# Patient Record
Sex: Female | Born: 1965 | Race: Black or African American | Hispanic: No | Marital: Married | Smoking: Former smoker
Health system: Southern US, Community
[De-identification: ages and names within clinical notes are randomized; demographics above are authoritative.]

## PROBLEM LIST (undated history)

## (undated) DIAGNOSIS — R600 Localized edema: Secondary | ICD-10-CM

## (undated) DIAGNOSIS — M1990 Unspecified osteoarthritis, unspecified site: Secondary | ICD-10-CM

## (undated) DIAGNOSIS — F419 Anxiety disorder, unspecified: Secondary | ICD-10-CM

## (undated) DIAGNOSIS — F32A Depression, unspecified: Secondary | ICD-10-CM

## (undated) DIAGNOSIS — I10 Essential (primary) hypertension: Secondary | ICD-10-CM

## (undated) HISTORY — PX: ABDOMINAL SURGERY: SHX537

## (undated) HISTORY — PX: OTHER SURGICAL HISTORY: SHX169

---

## 2011-01-17 ENCOUNTER — Emergency Department: Payer: Self-pay | Admitting: Emergency Medicine

## 2011-02-24 ENCOUNTER — Emergency Department: Payer: Self-pay | Admitting: Emergency Medicine

## 2011-06-27 ENCOUNTER — Emergency Department: Payer: Self-pay | Admitting: Emergency Medicine

## 2012-08-09 ENCOUNTER — Emergency Department: Payer: Self-pay | Admitting: Emergency Medicine

## 2012-09-07 ENCOUNTER — Emergency Department: Payer: Self-pay | Admitting: Emergency Medicine

## 2012-09-22 ENCOUNTER — Emergency Department: Payer: Self-pay | Admitting: Emergency Medicine

## 2012-11-23 ENCOUNTER — Emergency Department: Payer: Self-pay | Admitting: Unknown Physician Specialty

## 2012-11-24 LAB — URINALYSIS, COMPLETE
Blood: NEGATIVE
Glucose,UR: NEGATIVE mg/dL (ref 0–75)
Nitrite: NEGATIVE
Ph: 5 (ref 4.5–8.0)
Protein: 30
Specific Gravity: 1.035 (ref 1.003–1.030)
Squamous Epithelial: 1

## 2012-11-24 LAB — COMPREHENSIVE METABOLIC PANEL
Anion Gap: 10 (ref 7–16)
Chloride: 106 mmol/L (ref 98–107)
Co2: 26 mmol/L (ref 21–32)
Creatinine: 0.76 mg/dL (ref 0.60–1.30)
EGFR (African American): >60
Potassium: 3.9 mmol/L (ref 3.5–5.1)
SGOT(AST): 17 U/L (ref 15–37)
SGPT (ALT): 14 U/L (ref 12–78)
Sodium: 142 mmol/L (ref 136–145)
Total Protein: 8.4 g/dL — ABNORMAL HIGH (ref 6.4–8.2)

## 2012-11-24 LAB — CBC
HCT: 38.4 % (ref 35.0–47.0)
MCH: 31.6 pg (ref 26.0–34.0)
MCHC: 33.8 g/dL (ref 32.0–36.0)
MCV: 93 fL (ref 80–100)
RBC: 4.11 10*6/uL (ref 3.80–5.20)

## 2013-06-21 ENCOUNTER — Emergency Department: Payer: Self-pay | Admitting: Emergency Medicine

## 2013-07-01 ENCOUNTER — Emergency Department: Payer: Self-pay | Admitting: Emergency Medicine

## 2013-07-04 ENCOUNTER — Emergency Department: Payer: Self-pay | Admitting: Emergency Medicine

## 2013-07-04 LAB — COMPREHENSIVE METABOLIC PANEL
Albumin: 3.7 g/dL (ref 3.4–5.0)
Anion Gap: 5 — ABNORMAL LOW (ref 7–16)
BUN: 17 mg/dL (ref 7–18)
Creatinine: 0.89 mg/dL (ref 0.60–1.30)
EGFR (Non-African Amer.): >60
Glucose: 91 mg/dL (ref 65–99)
Osmolality: 279 (ref 275–301)
Potassium: 3.8 mmol/L (ref 3.5–5.1)
Sodium: 139 mmol/L (ref 136–145)
Total Protein: 8 g/dL (ref 6.4–8.2)

## 2013-07-04 LAB — DRUG SCREEN, URINE
Amphetamines, Ur Screen: NEGATIVE (ref ?–1000)
Barbiturates, Ur Screen: NEGATIVE (ref ?–200)
Cocaine Metabolite,Ur ~~LOC~~: POSITIVE (ref ?–300)
MDMA (Ecstasy)Ur Screen: NEGATIVE (ref ?–500)
Phencyclidine (PCP) Ur S: NEGATIVE (ref ?–25)

## 2013-07-04 LAB — URINALYSIS, COMPLETE
Bilirubin,UR: NEGATIVE
Glucose,UR: NEGATIVE mg/dL (ref 0–75)
Ketone: NEGATIVE
Leukocyte Esterase: NEGATIVE
Nitrite: NEGATIVE
Protein: 30
RBC,UR: 6 /HPF (ref 0–5)
Specific Gravity: 1.036 (ref 1.003–1.030)
Squamous Epithelial: 1
WBC UR: 1 /HPF (ref 0–5)

## 2013-07-04 LAB — CBC
HCT: 39.8 % (ref 35.0–47.0)
MCH: 31.7 pg (ref 26.0–34.0)
MCHC: 35.1 g/dL (ref 32.0–36.0)
MCV: 90 fL (ref 80–100)
RDW: 14 % (ref 11.5–14.5)
WBC: 5.5 10*3/uL (ref 3.6–11.0)

## 2013-07-04 LAB — TROPONIN I: Troponin-I: <0.02 ng/mL

## 2013-12-10 ENCOUNTER — Emergency Department: Payer: Self-pay | Admitting: Emergency Medicine

## 2014-01-12 ENCOUNTER — Emergency Department: Payer: Self-pay | Admitting: Emergency Medicine

## 2015-05-28 ENCOUNTER — Encounter: Payer: Self-pay | Admitting: Emergency Medicine

## 2015-05-28 ENCOUNTER — Emergency Department
Admission: EM | Admit: 2015-05-28 | Discharge: 2015-05-28 | Disposition: A | Discharge status: Home / Self Care | Payer: Self-pay | Attending: Emergency Medicine | Admitting: Emergency Medicine

## 2015-05-28 DIAGNOSIS — J029 Acute pharyngitis, unspecified: Secondary | ICD-10-CM | POA: Insufficient documentation

## 2015-05-28 LAB — CBC WITH DIFFERENTIAL/PLATELET
Basophils Absolute: 0.1 10*3/uL (ref 0–0.1)
Basophils Relative: 1 %
EOS PCT: 4 %
Eosinophils Absolute: 0.3 10*3/uL (ref 0–0.7)
HCT: 40.5 % (ref 35.0–47.0)
Hemoglobin: 13.6 g/dL (ref 12.0–16.0)
LYMPHS PCT: 26 %
Lymphs Abs: 2.4 10*3/uL (ref 1.0–3.6)
MCH: 31 pg (ref 26.0–34.0)
MCHC: 33.6 g/dL (ref 32.0–36.0)
MCV: 92.2 fL (ref 80.0–100.0)
MONOS PCT: 9 %
Monocytes Absolute: 0.8 10*3/uL (ref 0.2–0.9)
Neutro Abs: 5.5 10*3/uL (ref 1.4–6.5)
Neutrophils Relative %: 60 %
Platelets: 197 10*3/uL (ref 150–440)
RBC: 4.39 MIL/uL (ref 3.80–5.20)
RDW: 14.2 % (ref 11.5–14.5)
WBC: 9.1 10*3/uL (ref 3.6–11.0)

## 2015-05-28 LAB — BASIC METABOLIC PANEL
ANION GAP: 6 (ref 5–15)
BUN: 13 mg/dL (ref 6–20)
CHLORIDE: 105 mmol/L (ref 101–111)
CO2: 27 mmol/L (ref 22–32)
Calcium: 8.6 mg/dL — ABNORMAL LOW (ref 8.9–10.3)
Creatinine, Ser: 0.88 mg/dL (ref 0.44–1.00)
GFR calc Af Amer: >60 mL/min (ref >60)
GFR calc non Af Amer: >60 mL/min (ref >60)
GLUCOSE: 92 mg/dL (ref 65–99)
Potassium: 4.1 mmol/L (ref 3.5–5.1)
SODIUM: 138 mmol/L (ref 135–145)

## 2015-05-28 MED ORDER — SODIUM CHLORIDE 0.9 % IV BOLUS (SEPSIS)
500.0000 mL | Freq: Once | INTRAVENOUS | Status: AC
  Administered 2015-05-28: 500 mL via INTRAVENOUS

## 2015-05-28 MED ORDER — OXYCODONE-ACETAMINOPHEN 5-325 MG PO TABS
1.0000 | ORAL_TABLET | Freq: Once | ORAL | Status: AC
  Administered 2015-05-28: 1 via ORAL
  Filled 2015-05-28: qty 1

## 2015-05-28 MED ORDER — TRAMADOL HCL 50 MG PO TABS: 50.0000 mg | ORAL_TABLET | Freq: Four times a day (QID) | ORAL | Status: DC | PRN

## 2015-05-28 MED ORDER — AMOXICILLIN-POT CLAVULANATE 875-125 MG PO TABS: 1.0000 | ORAL_TABLET | Freq: Two times a day (BID) | ORAL | Status: AC

## 2015-05-28 MED ORDER — SODIUM CHLORIDE 0.9 % IV SOLN
3.0000 g | Freq: Once | INTRAVENOUS | Status: AC
  Administered 2015-05-28: 3 g via INTRAVENOUS
  Filled 2015-05-28: qty 3

## 2015-05-28 NOTE — Discharge Instructions (Signed)
FOLLOW UP WITH [facility] ABOUT YOUR MEDICATION AND [facility].  PAPERS ARE GIVEN TO YOU TONIGHT TO FILL OUT TO TAKE TO THE OFFICE ULTRAM FOR PAIN AND AUGMENTIN FOR INFECTION INCREASE FLUIDS RETURN TO ER IMMEDIATELY IF UNABLE TO SWALLOW YOUR SPIT, FEVER OF 101 OR UNABLE TO TAKE ANTIBIOTICS TYLENOL IF NEEDED FOR FEVER

## 2015-05-28 NOTE — ED Notes (Signed)
Pt presents with sore throat and fever for two days.

## 2015-05-28 NOTE — ED Provider Notes (Signed)
[facility] Emergency Department Provider Note  ____________________________________________  Time seen: 1634  I have reviewed the triage vital signs and the nursing notes.   HISTORY  Chief Complaint Sore Throat   HPI Lindsay Frye is a 49 y.o. female is here with complaint of sore throat and fever 2 days. She has not taken any over-the-counter medication for this. She does not have a family doctor. She states that talking and swallowing hurts her throat, she is able to swallow her saliva with out any difficulty. She's been drinking fluids at home. She is not aware of being exposed to strep. Pain is constant, she feels in her left ear, she rates her pain a 10 out of 10.  History reviewed. No pertinent past medical history.  There are no active problems to display for this patient.   Past Surgical History  Procedure Laterality Date  . Abdominal surgery      Current Outpatient Rx  Name  Route  Sig  Dispense  Refill  . amoxicillin-clavulanate (AUGMENTIN) 875-125 MG per tablet   Oral   Take 1 tablet by mouth 2 (two) times daily.   14 tablet   0   . traMADol (ULTRAM) 50 MG tablet   Oral   Take 1 tablet (50 mg total) by mouth every 6 (six) hours as needed.   20 tablet   0     Allergies Review of patient's allergies indicates no known allergies.  No family history on file.  Social History History  Substance Use Topics  . Smoking status: Never Smoker   . Smokeless tobacco: Not on file  . Alcohol Use: No    Review of Systems Constitutional: Positive fever/chills Eyes: No visual changes. ENT: Positive sore throat. Cardiovascular: Denies chest pain. Respiratory: Denies shortness of breath. Gastrointestinal: No abdominal pain.  No nausea, no vomiting.  Genitourinary: Negative for dysuria. Musculoskeletal: Negative for back pain. Skin: Negative for rash. Neurological: Negative for headaches, focal weakness or numbness.  10-point ROS  otherwise negative.  ____________________________________________   PHYSICAL EXAM:  VITAL SIGNS: ED Triage Vitals  Enc Vitals Group     BP 05/28/15 1610 124/84 mmHg     Pulse Rate 05/28/15 1610 75     Resp 05/28/15 1610 20     Temp 05/28/15 1610 98.4 F (36.9 C)     Temp Source 05/28/15 1610 Oral     SpO2 05/28/15 1610 97 %     Weight 05/28/15 1610 200 lb (90.719 kg)     Height 05/28/15 1610  (1.702 m)     Head Cir --      Peak Flow --      Pain Score 05/28/15 1610 10     Pain Loc --      Pain Edu? --      Excl. in GC? --     Constitutional: Alert and oriented. Well appearing and in no acute distress. Eyes: Conjunctivae are normal. PERRL. EOMI. Head: Atraumatic. Nose: No congestion/rhinnorhea. Mouth/Throat: Mucous membranes are moist.  Oropharynx erythematous without exudate. Uvula is midline. Patient's voice is muffled Neck: No stridor.  Supple Hematological/Lymphatic/Immunilogical: Moderate bilateral cervical lymphadenopathy. Cardiovascular: Normal rate, regular rhythm. Grossly normal heart sounds.  Good peripheral circulation. Respiratory: Normal respiratory effort.  No retractions. Lungs CTAB. Gastrointestinal: Soft and nontender. No distention. Musculoskeletal: No lower extremity tenderness nor edema.  No joint effusions. Neurologic:  Normal speech and language. No gross focal neurologic deficits are appreciated. No gait instability. Skin:  Skin  is warm, dry and intact. No rash noted. Psychiatric: Mood and affect are normal. Speech and behavior are normal.  ____________________________________________   LABS (all labs ordered are listed, but only abnormal results are displayed)  Labs Reviewed  BASIC METABOLIC PANEL - Abnormal; Notable for the following:    Calcium 8.6 (*)    All other components within normal limits  CBC WITH DIFFERENTIAL/PLATELET    PROCEDURES  Procedure(s) performed: None  Critical Care performed:  No  ____________________________________________   INITIAL IMPRESSION / ASSESSMENT AND PLAN / ED COURSE  Pertinent labs & imaging results that were available during my care of the patient were reviewed by me and considered in my medical decision making (see chart for details).  Patient was able to drink fluids while in the ED but was quite uncomfortable. There is some financial burden and she was given papers for [employer] and [facility].  While in the emergency room she was given Unasyn 3 g IV and Percocet for throat pain. She is instructed to return to the emergency room immediately if unable to swallow her own saliva, fever above 101, or inability to take her medication. Questionable early peritonsillar cellulitis. ____________________________________________   FINAL CLINICAL IMPRESSION(S) / ED DIAGNOSES  Final diagnoses:  Pharyngitis      Tommi Rumps, PA-C 05/28/15 1943  Emily Filbert, MD 05/28/15 2124

## 2015-10-17 ENCOUNTER — Emergency Department: Payer: Self-pay

## 2015-10-17 ENCOUNTER — Encounter: Payer: Self-pay | Admitting: Emergency Medicine

## 2015-10-17 ENCOUNTER — Emergency Department
Admission: EM | Admit: 2015-10-17 | Discharge: 2015-10-17 | Disposition: A | Discharge status: Home / Self Care | Payer: Self-pay | Attending: Emergency Medicine | Admitting: Emergency Medicine

## 2015-10-17 DIAGNOSIS — S93402A Sprain of unspecified ligament of left ankle, initial encounter: Secondary | ICD-10-CM | POA: Insufficient documentation

## 2015-10-17 DIAGNOSIS — Y9301 Activity, walking, marching and hiking: Secondary | ICD-10-CM | POA: Insufficient documentation

## 2015-10-17 DIAGNOSIS — Y92007 Garden or yard of unspecified non-institutional (private) residence as the place of occurrence of the external cause: Secondary | ICD-10-CM | POA: Insufficient documentation

## 2015-10-17 DIAGNOSIS — Y998 Other external cause status: Secondary | ICD-10-CM | POA: Insufficient documentation

## 2015-10-17 DIAGNOSIS — W1839XA Other fall on same level, initial encounter: Secondary | ICD-10-CM | POA: Insufficient documentation

## 2015-10-17 MED ORDER — HYDROCODONE-ACETAMINOPHEN 5-325 MG PO TABS
1.0000 | ORAL_TABLET | Freq: Once | ORAL | Status: AC
  Administered 2015-10-17: 1 via ORAL
  Filled 2015-10-17: qty 1

## 2015-10-17 MED ORDER — HYDROCODONE-ACETAMINOPHEN 5-325 MG PO TABS: 1.0000 | ORAL_TABLET | ORAL | Status: DC | PRN

## 2015-10-17 NOTE — Discharge Instructions (Signed)
Ankle Sprain °An ankle sprain is an injury to the strong, fibrous tissues (ligaments) that hold your ankle bones together.  °HOME CARE  °· Put ice on your ankle for 1-2 days or as told by your doctor. °· Put ice in a plastic bag. °· Place a towel between your skin and the bag. °· Leave the ice on for 15-20 minutes at a time, every 2 hours while you are awake. °· Only take medicine as told by your doctor. °· Raise (elevate) your injured ankle above the level of your heart as much as possible for 2-3 days. °· Use crutches if your doctor tells you to. Slowly put your own weight on the affected ankle. Use the crutches until you can walk without pain. °· If you have a plaster splint: °· Do not rest it on anything harder than a pillow for 24 hours. °· Do not put weight on it. °· Do not get it wet. °· Take it off to shower or bathe. °· If given, use an elastic wrap or support stocking for support. Take the wrap off if your toes lose feeling (numb), tingle, or turn cold or blue. °· If you have an air splint: °· Add or let out air to make it comfortable. °· Take it off at night and to shower and bathe. °· Wiggle your toes and move your ankle up and down often while you are wearing it. °GET HELP IF: °· You have rapidly increasing bruising or puffiness (swelling). °· Your toes feel very cold. °· You lose feeling in your foot. °· Your medicine does not help your pain. °GET HELP RIGHT AWAY IF:  °· Your toes lose feeling (numb) or turn blue. °· You have severe pain that is increasing. °MAKE SURE YOU:  °· Understand these instructions. °· Will watch your condition. °· Will get help right away if you are not doing well or get worse. °  °This information is not intended to replace advice given to you by your health care provider. Make sure you discuss any questions you have with your health care provider. °  °Document Released: 04/07/2008 Document Revised: 11/10/2014 Document Reviewed: 05/03/2012 °Elsevier Interactive Patient  Education ©2016 [employer] ° °Cryotherapy °Cryotherapy is when you put ice on your injury. Ice helps lessen pain and puffiness (swelling) after an injury. Ice works the best when you start using it in the first 24 to 48 hours after an injury. °HOME CARE °· Put a dry or damp towel between the ice pack and your skin. °· You may press gently on the ice pack. °· Leave the ice on for no more than 10 to 20 minutes at a time. °· Check your skin after 5 minutes to make sure your skin is okay. °· Rest at least 20 minutes between ice pack uses. °· Stop using ice when your skin loses feeling (numbness). °· Do not use ice on someone who cannot tell you when it hurts. This includes small children and people with memory problems (dementia). °GET HELP RIGHT AWAY IF: °· You have white spots on your skin. °· Your skin turns blue or pale. °· Your skin feels waxy or hard. °· Your puffiness gets worse. °MAKE SURE YOU:  °· Understand these instructions. °· Will watch your condition. °· Will get help right away if you are not doing well or get worse. °  °This information is not intended to replace advice given to you by your health care provider. Make sure you discuss any   questions you have with your health care provider.   Document Released: 04/07/2008 Document Revised: 01/12/2012 Document Reviewed: 06/12/2011 Elsevier Interactive Patient Education 2016 [employer]    Ice and elevate ankle to reduce swelling and pain. Wear ankle brace for approximately 1 week or until you can bear weight without pain. Take Norco as needed for pain. Follow up with Dr. Ernest PineHooten if any continued problems.

## 2015-10-17 NOTE — ED Notes (Signed)
Left ankle pain s/p fall yesterday and today, c/o pain that starts at the bottom of her foot and runs up the lower shin.

## 2015-10-17 NOTE — ED Provider Notes (Signed)
[facility] Emergency Department Provider Note  ____________________________________________  Time seen: Approximately 11:27 AM  I have reviewed the triage vital signs and the nursing notes.   HISTORY  Chief Complaint Ankle Pain  HPI Lindsay Frye is a 49 y.o. female is here with complaint of left ankle pain. Patient states she fell last evening while walking her dog in the backyard. She states she fell again today inside the house because of the pain in her left ankle. Patient denies any previous injury to her ankle. She states she has not taken any over-the-counter medications such as Tylenol or ibuprofen because she did not have any.She rates her pain as a 10 out of 10. She denies any head injury or loss of consciousness during this event.   History reviewed. No pertinent past medical history.  There are no active problems to display for this patient.   Past Surgical History  Procedure Laterality Date  . Abdominal surgery    . Skull plate      Current Outpatient Rx  Name  Route  Sig  Dispense  Refill  . HYDROcodone-acetaminophen (NORCO/VICODIN) 5-325 MG tablet   Oral   Take 1 tablet by mouth every 4 (four) hours as needed for moderate pain.   20 tablet   0     Allergies Review of patient's allergies indicates no known allergies.  No family history on file.  Social History Social History  Substance Use Topics  . Smoking status: Never Smoker   . Smokeless tobacco: None  . Alcohol Use: No    Review of Systems Constitutional: No fever/chills Eyes: No visual changes. ENT: No trauma Cardiovascular: Denies chest pain. Respiratory: Denies shortness of breath. Gastrointestinal:   No nausea, no vomiting.   Musculoskeletal: Negative for back pain. Positive left ankle pain Skin: Negative for rash. Neurological: Negative for headaches, focal weakness or numbness.  10-point ROS otherwise  negative.  ____________________________________________   PHYSICAL EXAM:  VITAL SIGNS: ED Triage Vitals  Enc Vitals Group     BP --      Pulse --      Resp --      Temp --      Temp src --      SpO2 --      Weight --      Height --      Head Cir --      Peak Flow --      Pain Score --      Pain Loc --      Pain Edu? --      Excl. in GC? --     Constitutional: Alert and oriented. Well appearing and in no acute distress. Eyes: Conjunctivae are normal. PERRL. EOMI. Head: Atraumatic. Nose: No congestion/rhinnorhea. Neck: No stridor.  No cervical tenderness on palpation. Cardiovascular: Normal rate, regular rhythm. Grossly normal heart sounds.  Good peripheral circulation. Respiratory: Normal respiratory effort.  No retractions. Lungs CTAB. Gastrointestinal: Soft and nontender. No distention.  Musculoskeletal: Left ankle no gross deformity was noted. There is minimal soft tissue edema lateral aspect of the left ankle. Range of motion is restricted secondary to patient's pain. Motor sensory function intact. No edema or abrasions noted on the tib-fib area. Neurologic:  Normal speech and language. No gross focal neurologic deficits are appreciated. No gait instability. Skin:  Skin is warm, dry and intact. No rash noted. No ecchymosis or abrasions are noted to the left extremity Psychiatric: Mood and affect are normal. Speech  and behavior are normal.  ____________________________________________   LABS (all labs ordered are listed, but only abnormal results are displayed)  Labs Reviewed - No data to display  RADIOLOGY  Left ankle x-ray per radiologist shows no evidence of acute fracture, dislocation or joint effusion. I, Tommi Rumps, personally viewed and evaluated these images (plain radiographs) as part of my medical decision making.  ____________________________________________   PROCEDURES  Procedure(s) performed: None  Critical Care performed:  No  ____________________________________________   INITIAL IMPRESSION / ASSESSMENT AND PLAN / ED COURSE  Pertinent labs & imaging results that were available during my care of the patient were reviewed by me and considered in my medical decision making (see chart for details)  Patient was placed in a stirrup ankle splint for support. She is given a prescription for Norco as needed for pain. She is to follow-up with Dr.Hooten if any continued problems with her ankle..   ____________________________________________   FINAL CLINICAL IMPRESSION(S) / ED DIAGNOSES  Final diagnoses:  Left ankle sprain, initial encounter      Tommi Rumps, PA-C 10/17/15 1321  Rockne Menghini, MD 10/17/15 1345

## 2017-04-18 ENCOUNTER — Emergency Department: Payer: Self-pay

## 2017-04-18 ENCOUNTER — Emergency Department
Admission: EM | Admit: 2017-04-18 | Discharge: 2017-04-18 | Disposition: A | Discharge status: Home / Self Care | Payer: Self-pay | Attending: Student in an Organized Health Care Education/Training Program | Admitting: Student in an Organized Health Care Education/Training Program

## 2017-04-18 ENCOUNTER — Encounter: Payer: Self-pay | Admitting: Emergency Medicine

## 2017-04-18 DIAGNOSIS — J069 Acute upper respiratory infection, unspecified: Secondary | ICD-10-CM | POA: Insufficient documentation

## 2017-04-18 DIAGNOSIS — B9789 Other viral agents as the cause of diseases classified elsewhere: Secondary | ICD-10-CM

## 2017-04-18 MED ORDER — HYDROCOD POLST-CPM POLST ER 10-8 MG/5ML PO SUER: 5.0000 mL | Freq: Two times a day (BID) | ORAL | 0 refills | Status: DC

## 2017-04-18 MED ORDER — HYDROCOD POLST-CPM POLST ER 10-8 MG/5ML PO SUER
5.0000 mL | Freq: Once | ORAL | Status: AC
  Administered 2017-04-18: 5 mL via ORAL
  Filled 2017-04-18: qty 5

## 2017-04-18 MED ORDER — BENZONATATE 100 MG PO CAPS: 100.0000 mg | ORAL_CAPSULE | Freq: Three times a day (TID) | ORAL | 0 refills | Status: DC | PRN

## 2017-04-18 NOTE — ED Triage Notes (Signed)
Pt ambulatory to triage in NAD, report productive cough x 1 week w/ yellow and white sputum, denies CP.

## 2017-04-18 NOTE — ED Notes (Signed)
Pt stating cough for the last week. Pt has taken Theraflu and Robitussin. Pt stating that she has been "sweating" but is unsure of fevers. Pt denying SOB. Pt stating pain with coughing in her left posterior rib cage. Pt stating that she is coughing up mucous.

## 2017-04-18 NOTE — ED Notes (Signed)
Patient transported to X-ray 

## 2017-04-18 NOTE — ED Provider Notes (Signed)
[facility] Emergency Department Provider Note   ____________________________________________   First MD Initiated Contact with Patient 04/18/17 1633     (approximate)  I have reviewed the triage vital signs and the nursing notes.   HISTORY  Chief Complaint Cough    HPI Lindsay Frye is a 51 y.o. female patient complaining of productive cough for 1 week. Patient state has sputum is yellow and white. Patient state chest wall pain secondary to cough. Patient denies nasal congestion or runny nose. Patient denies nausea, vomiting, or diarrhea. Patient rates the pain as a 6/10 only when coughing. Patient say caused refractory to over-the-counter medication consisting of Mucinex, DayQuil, and NyQuil.   History reviewed. No pertinent past medical history.  There are no active problems to display for this patient.   Past Surgical History:  Procedure Laterality Date  . ABDOMINAL SURGERY    . skull plate      Prior to Admission medications   Medication Sig Start Date End Date Taking? Authorizing Provider  chlorpheniramine-HYDROcodone (TUSSIONEX PENNKINETIC ER) 10-8 MG/5ML SUER Take 5 mLs by mouth 2 (two) times daily. 04/18/17   Joni ReiningSmith, Chanele Douglas K, PA-C  HYDROcodone-acetaminophen (NORCO/VICODIN) 5-325 MG tablet Take 1 tablet by mouth every 4 (four) hours as needed for moderate pain. 10/17/15   Tommi RumpsSummers, Rhonda L, PA-C    Allergies Patient has no known allergies.  History reviewed. No pertinent family history.  Social History Social History  Substance Use Topics  . Smoking status: Never Smoker  . Smokeless tobacco: Never Used  . Alcohol use No    Review of Systems Constitutional: No fever/chills Eyes: No visual changes. ENT: No sore throat. Cardiovascular: Denies chest pain. Respiratory: Denies shortness of breath. Productive cough Gastrointestinal: No abdominal pain.  No nausea, no vomiting.  No diarrhea.  No constipation. Genitourinary: Negative  for dysuria. Musculoskeletal: Negative for back pain. Skin: Negative for rash. Neurological: Negative for headaches, focal weakness or numbness. ____________________________________________   PHYSICAL EXAM:  VITAL SIGNS: ED Triage Vitals  Enc Vitals Group     BP 04/18/17 1624 (!) 149/79     Pulse Rate 04/18/17 1624 63     Resp 04/18/17 1624 20     Temp 04/18/17 1624 98.3 F (36.8 C)     Temp Source 04/18/17 1624 Oral     SpO2 04/18/17 1624 96 %     Weight 04/18/17 1625 207 lb (93.9 kg)     Height 04/18/17 1625 5\' 7"  (1.702 m)     Head Circumference --      Peak Flow --      Pain Score 04/18/17 1624 0     Pain Loc --      Pain Edu? --      Excl. in GC? --     Constitutional: Alert and oriented. Well appearing and in no acute distress. Head: Atraumatic. Nose: No congestion/rhinnorhea. Mouth/Throat: Mucous membranes are moist.  Oropharynx non-erythematous. Neck: No stridor.  No cervical spine tenderness to palpation. Hematological/Lymphatic/Immunilogical: No cervical lymphadenopathy. Cardiovascular: Normal rate, regular rhythm. Grossly normal heart sounds.  Good peripheral circulation. Respiratory: Normal respiratory effort.  No retractions. Lungs CTAB. Gastrointestinal: Soft and nontender. No distention. No abdominal bruits. No CVA tenderness. Musculoskeletal: No lower extremity tenderness nor edema.  No joint effusions. Neurologic:  Normal speech and language. No gross focal neurologic deficits are appreciated. No gait instability. Skin:  Skin is warm, dry and intact. No rash noted. Psychiatric: Mood and affect are normal. Speech and behavior are  normal.  ____________________________________________   LABS (all labs ordered are listed, but only abnormal results are displayed)  Labs Reviewed - No data to display ____________________________________________  EKG   ____________________________________________  RADIOLOGY  Dg Chest 2 View  Result Date:  04/18/2017 CLINICAL DATA:  Productive cough for 1 week. EXAM: CHEST  2 VIEW COMPARISON:  July 04, 2013 FINDINGS: The heart, hila, and mediastinum are normal. No pneumothorax. No pulmonary nodules or masses. No focal infiltrates are identified. IMPRESSION: No active cardiopulmonary disease. Electronically Signed   By: Gerome Sam III M.D   On: 04/18/2017 17:17   No acute findings on chest x-ray ____________________________________________   PROCEDURES  Procedure(s) performed: None  Procedures  Critical Care performed: No  ____________________________________________   INITIAL IMPRESSION / ASSESSMENT AND PLAN / ED COURSE  Pertinent labs & imaging results that were available during my care of the patient were reviewed by me and considered in my medical decision making (see chart for details).  Viral upper respiratory infection with cough. Discussed negative x-ray finding with patient. Patient given discharge Instructions. Patient advised [facility].      ____________________________________________   FINAL CLINICAL IMPRESSION(S) / ED DIAGNOSES  Final diagnoses:  Viral URI with cough      NEW MEDICATIONS STARTED DURING THIS VISIT:  New Prescriptions   CHLORPHENIRAMINE-HYDROCODONE (TUSSIONEX PENNKINETIC ER) 10-8 MG/5ML SUER    Take 5 mLs by mouth 2 (two) times daily.     Note:  This document was prepared using Dragon voice recognition software and may include unintentional dictation errors.    Binta, Statzer, PA-C 04/18/17 1729    Willy Eddy, MD 04/18/17 [phone]

## 2017-06-12 ENCOUNTER — Emergency Department: Payer: Self-pay

## 2017-06-12 ENCOUNTER — Emergency Department
Admission: EM | Admit: 2017-06-12 | Discharge: 2017-06-12 | Disposition: A | Discharge status: Home / Self Care | Payer: Self-pay | Attending: Emergency Medicine | Admitting: Emergency Medicine

## 2017-06-12 ENCOUNTER — Encounter: Payer: Self-pay | Admitting: Emergency Medicine

## 2017-06-12 DIAGNOSIS — Z79899 Other long term (current) drug therapy: Secondary | ICD-10-CM | POA: Insufficient documentation

## 2017-06-12 DIAGNOSIS — M25561 Pain in right knee: Secondary | ICD-10-CM | POA: Insufficient documentation

## 2017-06-12 MED ORDER — MELOXICAM 7.5 MG PO TABS: 7.5000 mg | ORAL_TABLET | Freq: Every day | ORAL | 1 refills | Status: AC

## 2017-06-12 MED ORDER — TRAMADOL HCL 50 MG PO TABS: 50.0000 mg | ORAL_TABLET | Freq: Four times a day (QID) | ORAL | 0 refills | Status: AC | PRN

## 2017-06-12 MED ORDER — MELOXICAM 7.5 MG PO TABS
7.5000 mg | ORAL_TABLET | Freq: Once | ORAL | Status: AC
  Administered 2017-06-12: 7.5 mg via ORAL
  Filled 2017-06-12: qty 1

## 2017-06-12 MED ORDER — TRAMADOL HCL 50 MG PO TABS
50.0000 mg | ORAL_TABLET | Freq: Once | ORAL | Status: AC
  Administered 2017-06-12: 50 mg via ORAL
  Filled 2017-06-12: qty 1

## 2017-06-12 NOTE — ED Triage Notes (Signed)
Felt right knee 'POP" today while mowing lawn. C/O pain to right knee.

## 2017-06-12 NOTE — ED Provider Notes (Signed)
[facility] Emergency Department Provider Note  ____________________________________________  Time seen: Approximately 11:15 PM  I have reviewed the triage vital signs and the nursing notes.   HISTORY  Chief Complaint Knee Injury    HPI Lindsay HoraMargie E Bilek is a 51 y.o. female presenting to the emergency department with right knee pain. Patient states that she was mowing the lawn earlier today and felt a "pop" along the lateral femoral condyle. Patient states that she is currently now experiencing 10 out of 10 right knee pain. Patient states that she is prone to falls due to an injury several years ago. No other alleviating measures have been attempted.   History reviewed. No pertinent past medical history.  There are no active problems to display for this patient.   Past Surgical History:  Procedure Laterality Date  . ABDOMINAL SURGERY    . skull plate      Prior to Admission medications   Medication Sig Start Date End Date Taking? Authorizing Provider  benzonatate (TESSALON PERLES) 100 MG capsule Take 1 capsule (100 mg total) by mouth 3 (three) times daily as needed for cough. 04/18/17   Joni ReiningSmith, Ronald K, PA-C  chlorpheniramine-HYDROcodone (TUSSIONEX PENNKINETIC ER) 10-8 MG/5ML SUER Take 5 mLs by mouth 2 (two) times daily. 04/18/17   Joni ReiningSmith, Ronald K, PA-C  HYDROcodone-acetaminophen (NORCO/VICODIN) 5-325 MG tablet Take 1 tablet by mouth every 4 (four) hours as needed for moderate pain. 10/17/15   Tommi RumpsSummers, Rhonda L, PA-C  meloxicam (MOBIC) 7.5 MG tablet Take 1 tablet (7.5 mg total) by mouth daily. 06/12/17 06/19/17  Orvil FeilWoods, Jaclyn M, PA-C  traMADol (ULTRAM) 50 MG tablet Take 1 tablet (50 mg total) by mouth every 6 (six) hours as needed. 06/12/17 06/15/17  Orvil FeilWoods, Jaclyn M, PA-C    Allergies Patient has no known allergies.  No family history on file.  Social History Social History  Substance Use Topics  . Smoking status: Never Smoker  . Smokeless tobacco: Never  Used  . Alcohol use No     Review of Systems  Constitutional: No fever/chills Eyes: No visual changes. No discharge ENT: No upper respiratory complaints. Cardiovascular: no chest pain. Respiratory: no cough. No SOB. Gastrointestinal: No abdominal pain.  No nausea, no vomiting.  No diarrhea.  No constipation. Musculoskeletal: Patient has right knee pain. Skin: Negative for rash, abrasions, lacerations, ecchymosis. Neurological: Negative for headaches, focal weakness or numbness.   ____________________________________________   PHYSICAL EXAM:  VITAL SIGNS: ED Triage Vitals  Enc Vitals Group     BP 06/12/17 2225 102/69     Pulse Rate 06/12/17 2225 72     Resp 06/12/17 2225 16     Temp 06/12/17 2225 98.1 F (36.7 C)     Temp Source 06/12/17 2225 Oral     SpO2 06/12/17 2225 100 %     Weight 06/12/17 2218 280 lb (127 kg)     Height 06/12/17 2217 5\' 7"  (1.702 m)     Head Circumference --      Peak Flow --      Pain Score 06/12/17 2217 8     Pain Loc --      Pain Edu? --      Excl. in GC? --      Constitutional: Alert and oriented. Well appearing and in no acute distress. Eyes: Conjunctivae are normal. PERRL. EOMI. Head: Atraumatic. Cardiovascular: Normal rate, regular rhythm. Normal S1 and S2.  Good peripheral circulation. Respiratory: Normal respiratory effort without tachypnea or retractions. Lungs CTAB.  Good air entry to the bases with no decreased or absent breath sounds. Musculoskeletal: Patient has 5 out of 5 strength in the lower extremities bilaterally. Patient has laxity with LCL testing, right. Negative anterior and posterior drawer test, right. Negative ballottement. Negative apprehension test, right. Neurologic:  Normal speech and language. No gross focal neurologic deficits are appreciated.  Skin:  Skin is warm, dry and intact. No rash noted. Psychiatric: Mood and affect are normal. Speech and behavior are normal. Patient exhibits appropriate insight and  judgement.   ____________________________________________   LABS (all labs ordered are listed, but only abnormal results are displayed)  Labs Reviewed - No data to display ____________________________________________  EKG   ____________________________________________  RADIOLOGY Geraldo Pitter, personally viewed and evaluated these images (plain radiographs) as part of my medical decision making, as well as reviewing the written report by the radiologist.  Dg Knee Complete 4 Views Right  Result Date: 06/12/2017 CLINICAL DATA:  Right knee pain after feeling a popping sensation in the knee mowing the grass today. EXAM: RIGHT KNEE - COMPLETE 4+ VIEW COMPARISON:  None. FINDINGS: Mild medial and lateral spur formation and minimal posterior patellar spur formation. No fracture, dislocation or effusion seen. IMPRESSION: No fracture.  Mild degenerative changes. Electronically Signed   By: Beckie Salts M.D.   On: 06/12/2017 22:51    ____________________________________________    PROCEDURES  Procedure(s) performed:    Procedures    Medications  traMADol (ULTRAM) tablet 50 mg (not administered)  meloxicam (MOBIC) tablet 7.5 mg (not administered)     ____________________________________________   INITIAL IMPRESSION / ASSESSMENT AND PLAN / ED COURSE  Pertinent labs & imaging results that were available during my care of the patient were reviewed by me and considered in my medical decision making (see chart for details).  Review of the [place] CSRS was performed in accordance of the N[facility] prior to dispensing any controlled drugs.     Assessment and plan Right knee pain Patient presents to the emergency department with right knee pain after mowing the lawn. Patient had laxity with LCL testing. I suspect that patient has strained or possibly torn her LCL. A knee immobilizer was provided in the emergency department as well as a walker. She was given tramadol and meloxicam in the  emergency department. She was discharged with meloxicam and tramadol. Patient was referred to orthopedics, Dr. Joice Lofts. Vital signs are reassuring prior to discharge. All patient questions were answered.     ____________________________________________  FINAL CLINICAL IMPRESSION(S) / ED DIAGNOSES  Final diagnoses:  Acute pain of right knee      NEW MEDICATIONS STARTED DURING THIS VISIT:  New Prescriptions   MELOXICAM (MOBIC) 7.5 MG TABLET    Take 1 tablet (7.5 mg total) by mouth daily.   TRAMADOL (ULTRAM) 50 MG TABLET    Take 1 tablet (50 mg total) by mouth every 6 (six) hours as needed.        This chart was dictated using voice recognition software/Dragon. Despite best efforts to proofread, errors can occur which can change the meaning. Any change was purely unintentional.    Gasper Lloyd 06/12/17 2320    Sharman Cheek, MD 06/15/17 2330

## 2017-06-12 NOTE — ED Notes (Signed)
Pt. Going howe with husband.  Pt. Given walker.

## 2017-07-20 ENCOUNTER — Emergency Department: Payer: Self-pay

## 2017-07-20 ENCOUNTER — Emergency Department
Admission: EM | Admit: 2017-07-20 | Discharge: 2017-07-20 | Disposition: A | Discharge status: Home / Self Care | Payer: Self-pay | Attending: Emergency Medicine | Admitting: Emergency Medicine

## 2017-07-20 ENCOUNTER — Encounter: Payer: Self-pay | Admitting: Emergency Medicine

## 2017-07-20 DIAGNOSIS — S8991XD Unspecified injury of right lower leg, subsequent encounter: Secondary | ICD-10-CM | POA: Insufficient documentation

## 2017-07-20 DIAGNOSIS — X58XXXA Exposure to other specified factors, initial encounter: Secondary | ICD-10-CM | POA: Insufficient documentation

## 2017-07-20 MED ORDER — KETOROLAC TROMETHAMINE 30 MG/ML IJ SOLN
30.0000 mg | Freq: Once | INTRAMUSCULAR | Status: AC
  Administered 2017-07-20: 30 mg via INTRAVENOUS
  Filled 2017-07-20: qty 1

## 2017-07-20 MED ORDER — IBUPROFEN 600 MG PO TABS: 600.0000 mg | ORAL_TABLET | Freq: Four times a day (QID) | ORAL | 0 refills | Status: DC | PRN

## 2017-07-20 NOTE — ED Notes (Signed)
See triage note  States she was seen for right knee s/p fall several weeks ago  States she is still having pain and swelling to right knee  Unable to afford ortho f/u

## 2017-07-20 NOTE — ED Provider Notes (Signed)
[facility] Emergency Department Provider Note  ____________________________________________  Time seen: Approximately 11:51 AM  I have reviewed the triage vital signs and the nursing notes.   HISTORY  Chief Complaint Knee Pain    HPI Lindsay Frye is a 51 y.o. female that presents to the Emergency Department with right knee pain for one month. Patient states that she was mowing lawn one month ago when she heard a pop. Pain has not improved with time. When she turns on her side she can feel a popping.Pain is worse with walking. She has been wearing her knee brace and is out of pain medication. Ibuprofen improves the pain. She has been elevating her leg. She does not have insurance so she has not been able to follow-up with orthopedics. No back pain, calf pain, numbness, tingling.   History reviewed. No pertinent past medical history.  There are no active problems to display for this patient.   Past Surgical History:  Procedure Laterality Date  . ABDOMINAL SURGERY    . skull plate      Prior to Admission medications   Medication Sig Start Date End Date Taking? Authorizing Provider  benzonatate (TESSALON PERLES) 100 MG capsule Take 1 capsule (100 mg total) by mouth 3 (three) times daily as needed for cough. 04/18/17   Joni Reining, PA-C  chlorpheniramine-HYDROcodone (TUSSIONEX PENNKINETIC ER) 10-8 MG/5ML SUER Take 5 mLs by mouth 2 (two) times daily. 04/18/17   Joni Reining, PA-C  HYDROcodone-acetaminophen (NORCO/VICODIN) 5-325 MG tablet Take 1 tablet by mouth every 4 (four) hours as needed for moderate pain. 10/17/15   Tommi Rumps, PA-C  ibuprofen (ADVIL,MOTRIN) 600 MG tablet Take 1 tablet (600 mg total) by mouth every 6 (six) hours as needed. 07/20/17   Enid Derry, PA-C    Allergies Patient has no known allergies.  History reviewed. No pertinent family history.  Social History Social History  Substance Use Topics  . Smoking status:  Never Smoker  . Smokeless tobacco: Never Used  . Alcohol use No     Review of Systems  Constitutional: No fever/chills Cardiovascular: No chest pain. Respiratory:  No SOB. Gastrointestinal:  No nausea, no vomiting.  Musculoskeletal: Positive for knee pain. Skin: Negative for rash, abrasions, lacerations, ecchymosis. Neurological: Negative for numbness or tingling   ____________________________________________   PHYSICAL EXAM:  VITAL SIGNS: ED Triage Vitals [07/20/17 1042]  Enc Vitals Group     BP 138/65     Pulse Rate 84     Resp 18     Temp 98.2 F (36.8 C)     Temp Source Oral     SpO2 96 %     Weight 280 lb (127 kg)     Height      Head Circumference      Peak Flow      Pain Score 7     Pain Loc      Pain Edu?      Excl. in GC?      Constitutional: Alert and oriented. Well appearing and in no acute distress. Eyes: Conjunctivae are normal. PERRL. EOMI. Head: Atraumatic. ENT:      Ears:      Nose: No congestion/rhinnorhea.      Mouth/Throat: Mucous membranes are moist.  Neck: No stridor.  Cardiovascular: Normal rate, regular rhythm.  Good peripheral circulation. Respiratory: Normal respiratory effort without tachypnea or retractions. Lungs CTAB. Good air entry to the bases with no decreased or absent breath sounds. Musculoskeletal:  Full range of motion to all extremities. No gross deformities appreciated. No tenderness to palpation of knee. No effusion noted. Negative anterior drawer, posterior drawer, varus, mcMurray, apley grind. Positive valgus.  Neurologic:  Normal speech and language. No gross focal neurologic deficits are appreciated.  Skin:  Skin is warm, dry and intact. No rash noted.   ____________________________________________   LABS (all labs ordered are listed, but only abnormal results are displayed)  Labs Reviewed - No data to  display ____________________________________________  EKG   ____________________________________________  RADIOLOGY Lexine Baton, personally viewed and evaluated these images (plain radiographs) as part of my medical decision making, as well as reviewing the written report by the radiologist.  Dg Knee Complete 4 Views Right  Result Date: 07/20/2017 CLINICAL DATA:  Right knee pain, status post fall weeks ago EXAM: RIGHT KNEE - COMPLETE 4+ VIEW COMPARISON:  06/12/2017 FINDINGS: No fracture or dislocation is seen. Mild degenerative changes, most prominent in the medial compartment. The visualized soft tissues are unremarkable. No definite suprapatellar knee joint effusion. IMPRESSION: No fracture or dislocation is seen. Mild degenerative changes. Electronically Signed   By: Charline Bills M.D.   On: 07/20/2017 11:28    ____________________________________________    PROCEDURES  Procedure(s) performed:    Procedures    Medications  ketorolac (TORADOL) 30 MG/ML injection 30 mg (30 mg Intravenous Given 07/20/17 1208)     ____________________________________________   INITIAL IMPRESSION / ASSESSMENT AND PLAN / ED COURSE  Pertinent labs & imaging results that were available during my care of the patient were reviewed by me and considered in my medical decision making (see chart for details).  Review of the [place] CSRS was performed in accordance of the NCMB prior to dispensing any controlled drugs.   Presented to emergency department knee pain for one month. She has been seen in this Emergency Department before and has been unable to follow up with orthopedics due to lack of insurance. She has positive valgus testing and I suspect a ligament injury. No acute abnormalities on the x-ray. She was given Toradol in the emergency room. Patient will be discharged home with prescriptions for ibuprofen. Case manager came to see the patient to assist patient in getting to see a  specialist. Patient is to follow up with orthopedics as directed. Patient is given ED precautions to return to the ED for any worsening or new symptoms.     ____________________________________________  FINAL CLINICAL IMPRESSION(S) / ED DIAGNOSES  Final diagnoses:  Injury of ligament of right knee, subsequent encounter      NEW MEDICATIONS STARTED DURING THIS VISIT:  Discharge Medication List as of 07/20/2017 12:56 PM    START taking these medications   Details  ibuprofen (ADVIL,MOTRIN) 600 MG tablet Take 1 tablet (600 mg total) by mouth every 6 (six) hours as needed., Starting Mon 07/20/2017, Print            This chart was dictated using voice recognition software/Dragon. Despite best efforts to proofread, errors can occur which can change the meaning. Any change was purely unintentional.    Enid Derry, PA-C 07/20/17 1413    Enid Derry, PA-C 07/20/17 1431    Emily Filbert, MD 07/20/17 [phone]

## 2017-07-20 NOTE — Clinical Social Work Note (Signed)
CSW received inappropriate consult regarding ortho follow up. RNCM Cheryl consulted and aware of need. CSW signing off as no further Social Work needs identified. Please reconsult if new Social Work needs arise.   Corlis Hove, Theresia Majors, [facility] Clinical Social Worker-ED [phone]

## 2017-07-20 NOTE — ED Triage Notes (Signed)
Pt to ed with c/o right knee pain for several weeks.  Pt states was seen here for same,  Pt with knee brace on at triage.

## 2017-07-20 NOTE — Care Management Note (Signed)
Case Management Note  Patient Details  Name: Lindsay Frye MRN: [id number] Date of Birth: 29-Nov-1965  Subjective/Objective:      Spoke to patient after call from [facility]. The patient has a knee problem that needs followup and no financial resources. I have given her an application for the [facility] charity care program, along with instructions. She has needs for a PCP also, and so I have given her a [employer] book so that she has access to other thing she needs.              Action/Plan:   Expected Discharge Date:                  Expected Discharge Plan:     In-House Referral:     Discharge planning Services     Post Acute Care Choice:    Choice offered to:     DME Arranged:    DME Agency:     HH Arranged:    HH Agency:     Status of Service:     If discussed at [employer] of Stay Meetings, dates discussed:    Additional Comments:  Berna Bue, RN 07/20/2017, 12:48 PM

## 2018-05-31 ENCOUNTER — Other Ambulatory Visit: Payer: Self-pay

## 2018-05-31 ENCOUNTER — Emergency Department
Admission: EM | Admit: 2018-05-31 | Discharge: 2018-05-31 | Disposition: A | Discharge status: Home / Self Care | Payer: Self-pay | Attending: Emergency Medicine | Admitting: Emergency Medicine

## 2018-05-31 DIAGNOSIS — K0889 Other specified disorders of teeth and supporting structures: Secondary | ICD-10-CM | POA: Insufficient documentation

## 2018-05-31 DIAGNOSIS — R609 Edema, unspecified: Secondary | ICD-10-CM

## 2018-05-31 DIAGNOSIS — R600 Localized edema: Secondary | ICD-10-CM | POA: Insufficient documentation

## 2018-05-31 LAB — COMPREHENSIVE METABOLIC PANEL
ALT: 16 U/L (ref 0–44)
AST: 21 U/L (ref 15–41)
Albumin: 3.7 g/dL (ref 3.5–5.0)
Alkaline Phosphatase: 55 U/L (ref 38–126)
Anion gap: 6 (ref 5–15)
BUN: 16 mg/dL (ref 6–20)
CHLORIDE: 107 mmol/L (ref 98–111)
CO2: 28 mmol/L (ref 22–32)
Calcium: 8.6 mg/dL — ABNORMAL LOW (ref 8.9–10.3)
Creatinine, Ser: 0.93 mg/dL (ref 0.44–1.00)
Glucose, Bld: 101 mg/dL — ABNORMAL HIGH (ref 70–99)
POTASSIUM: 3.4 mmol/L — AB (ref 3.5–5.1)
Sodium: 141 mmol/L (ref 135–145)
TOTAL PROTEIN: 6.8 g/dL (ref 6.5–8.1)
Total Bilirubin: 0.4 mg/dL (ref 0.3–1.2)

## 2018-05-31 LAB — CBC WITH DIFFERENTIAL/PLATELET
Basophils Absolute: 0.1 10*3/uL (ref 0–0.1)
Basophils Relative: 2 %
EOS PCT: 9 %
Eosinophils Absolute: 0.5 10*3/uL (ref 0–0.7)
HCT: 33.4 % — ABNORMAL LOW (ref 35.0–47.0)
Hemoglobin: 11.4 g/dL — ABNORMAL LOW (ref 12.0–16.0)
LYMPHS ABS: 2.8 10*3/uL (ref 1.0–3.6)
LYMPHS PCT: 45 %
MCH: 31.5 pg (ref 26.0–34.0)
MCHC: 34.2 g/dL (ref 32.0–36.0)
MCV: 92.1 fL (ref 80.0–100.0)
MONO ABS: 0.5 10*3/uL (ref 0.2–0.9)
MONOS PCT: 8 %
Neutro Abs: 2.2 10*3/uL (ref 1.4–6.5)
Neutrophils Relative %: 36 %
PLATELETS: 197 10*3/uL (ref 150–440)
RBC: 3.63 MIL/uL — ABNORMAL LOW (ref 3.80–5.20)
RDW: 14.1 % (ref 11.5–14.5)
WBC: 6.1 10*3/uL (ref 3.6–11.0)

## 2018-05-31 LAB — BRAIN NATRIURETIC PEPTIDE: B NATRIURETIC PEPTIDE 5: 67 pg/mL (ref 0.0–100.0)

## 2018-05-31 MED ORDER — FUROSEMIDE 20 MG PO TABS: 20.0000 mg | ORAL_TABLET | Freq: Every day | ORAL | 0 refills | Status: DC

## 2018-05-31 MED ORDER — IBUPROFEN 800 MG PO TABS: 800.0000 mg | ORAL_TABLET | Freq: Three times a day (TID) | ORAL | 0 refills | Status: DC | PRN

## 2018-05-31 NOTE — ED Triage Notes (Addendum)
Pt arrives to ED via POV from home with c/o bilateral leg swelling x2 days and dental pain x1 week. Pt denies h/x of CHF, no prescribed diuretics. Pt denies CP, no c/o N/V/D or fever, no SHOB.

## 2018-05-31 NOTE — ED Provider Notes (Signed)
[facility] Emergency Department Provider Note       Time seen: ----------------------------------------- 8:42 PM on 05/31/2018 -----------------------------------------   I have reviewed the triage vital signs and the nursing notes.  HISTORY   Chief Complaint Leg Swelling    HPI Lindsay Frye is a 52 y.o. female with no significant past medical history who presents to the ED for bilateral leg swelling for 2 days and also toothache for the last week.  She denies any history of this before, nothing makes it better or worse.  She denies any recent illness or other complaints.  History reviewed. No pertinent past medical history.  There are no active problems to display for this patient.   Past Surgical History:  Procedure Laterality Date  . ABDOMINAL SURGERY    . skull plate      Allergies Patient has no known allergies.  Social History Social History   Tobacco Use  . Smoking status: Never Smoker  . Smokeless tobacco: Never Used  Substance Use Topics  . Alcohol use: No  . Drug use: No   Review of Systems Constitutional: Negative for fever. ENT: Positive for toothache Cardiovascular: Negative for chest pain. Respiratory: Negative for shortness of breath. Gastrointestinal: Negative for abdominal pain, vomiting and diarrhea. Musculoskeletal: Positive for leg swelling Skin: Negative for rash. Neurological: Negative for headaches, focal weakness or numbness.  All systems negative/normal/unremarkable except as stated in the HPI  ____________________________________________   PHYSICAL EXAM:  VITAL SIGNS: ED Triage Vitals  Enc Vitals Group     BP 05/31/18 2010 131/79     Pulse Rate 05/31/18 2010 64     Resp 05/31/18 2010 18     Temp 05/31/18 2010 98.2 F (36.8 C)     Temp Source 05/31/18 2010 Oral     SpO2 05/31/18 2010 98 %     Weight 05/31/18 2009 280 lb (127 kg)     Height 05/31/18 2009 5\' 8"  (1.727 m)     Head Circumference  --      Peak Flow --      Pain Score 05/31/18 2008 8     Pain Loc --      Pain Edu? --      Excl. in GC? --    Constitutional: Alert and oriented. Well appearing and in no distress. Eyes: Conjunctivae are normal. Normal extraocular movements. ENT   Head: Normocephalic and atraumatic.   Nose: No congestion/rhinnorhea.   Mouth/Throat: Mucous membranes are moist.  Tooth decay is noted   Neck: No stridor. Cardiovascular: Normal rate, regular rhythm. No murmurs, rubs, or gallops. Respiratory: Normal respiratory effort without tachypnea nor retractions. Breath sounds are clear and equal bilaterally. No wheezes/rales/rhonchi. Gastrointestinal: Soft and nontender. Normal bowel sounds Musculoskeletal: Nontender with normal range of motion in extremities.  Mild bilateral pitting edema is noted Neurologic:  Normal speech and language. No gross focal neurologic deficits are appreciated.  Skin:  Skin is warm, dry and intact. No rash noted. Psychiatric: Mood and affect are normal. Speech and behavior are normal.  ____________________________________________  ED COURSE:  As part of my medical decision making, I reviewed the following data within the electronic MEDICAL RECORD NUMBER History obtained from family if available, nursing notes, old chart and ekg, as well as notes from prior ED visits. Patient presented for peripheral edema and toothache, we will assess with labs as indicated at this time   Procedures ____________________________________________   LABS (pertinent positives/negatives)  Labs Reviewed  CBC WITH DIFFERENTIAL/PLATELET  COMPREHENSIVE METABOLIC PANEL  BRAIN NATRIURETIC PEPTIDE  ____________________________________________  DIFFERENTIAL DIAGNOSIS   Peripheral edema, DVT unlikely, toothache, dental caries  FINAL ASSESSMENT AND PLAN  Peripheral edema, toothache   Plan: The patient had presented for peripheral edema and toothache. Patient's labs are  unremarkable, she will be discharged with a short supply of Lasix and ibuprofen for toothache.  She is cleared for outpatient follow-up.Ulice Dash.    Johnathan E Williams, MD   Note: This note was generated in part or whole with voice recognition software. Voice recognition is usually quite accurate but there are transcription errors that can and very often do occur. I apologize for any typographical errors that were not detected and corrected.     Emily FilbertWilliams, Jonathan E, MD 05/31/18 2044

## 2018-06-01 ENCOUNTER — Ambulatory Visit: Payer: Self-pay

## 2018-07-06 ENCOUNTER — Ambulatory Visit: Payer: Self-pay

## 2018-12-11 ENCOUNTER — Encounter: Payer: Self-pay | Admitting: Emergency Medicine

## 2018-12-11 ENCOUNTER — Other Ambulatory Visit: Payer: Self-pay

## 2018-12-11 ENCOUNTER — Emergency Department
Admission: EM | Admit: 2018-12-11 | Discharge: 2018-12-11 | Disposition: A | Discharge status: Home / Self Care | Payer: Self-pay | Attending: Emergency Medicine | Admitting: Emergency Medicine

## 2018-12-11 DIAGNOSIS — L03116 Cellulitis of left lower limb: Secondary | ICD-10-CM | POA: Insufficient documentation

## 2018-12-11 DIAGNOSIS — Z79899 Other long term (current) drug therapy: Secondary | ICD-10-CM | POA: Insufficient documentation

## 2018-12-11 LAB — CBC WITH DIFFERENTIAL/PLATELET
ABS IMMATURE GRANULOCYTES: 0.01 10*3/uL (ref 0.00–0.07)
Basophils Absolute: 0.1 10*3/uL (ref 0.0–0.1)
Basophils Relative: 1 %
Eosinophils Absolute: 0.5 10*3/uL (ref 0.0–0.5)
Eosinophils Relative: 8 %
HCT: 41.8 % (ref 36.0–46.0)
HEMOGLOBIN: 13.8 g/dL (ref 12.0–15.0)
IMMATURE GRANULOCYTES: 0 %
LYMPHS PCT: 37 %
Lymphs Abs: 2.4 10*3/uL (ref 0.7–4.0)
MCH: 32.4 pg (ref 26.0–34.0)
MCHC: 33 g/dL (ref 30.0–36.0)
MCV: 98.1 fL (ref 80.0–100.0)
MONO ABS: 0.5 10*3/uL (ref 0.1–1.0)
MONOS PCT: 8 %
NEUTROS ABS: 3.1 10*3/uL (ref 1.7–7.7)
Neutrophils Relative %: 46 %
Platelets: 197 10*3/uL (ref 150–400)
RBC: 4.26 MIL/uL (ref 3.87–5.11)
RDW: 13.2 % (ref 11.5–15.5)
WBC: 6.5 10*3/uL (ref 4.0–10.5)
nRBC: 0 % (ref 0.0–0.2)

## 2018-12-11 LAB — BASIC METABOLIC PANEL
ANION GAP: 6 (ref 5–15)
BUN: 16 mg/dL (ref 6–20)
CHLORIDE: 106 mmol/L (ref 98–111)
CO2: 29 mmol/L (ref 22–32)
Calcium: 8.7 mg/dL — ABNORMAL LOW (ref 8.9–10.3)
Creatinine, Ser: 0.6 mg/dL (ref 0.44–1.00)
GFR calc Af Amer: >60 mL/min (ref >60)
GFR calc non Af Amer: >60 mL/min (ref >60)
GLUCOSE: 93 mg/dL (ref 70–99)
POTASSIUM: 3.8 mmol/L (ref 3.5–5.1)
SODIUM: 141 mmol/L (ref 135–145)

## 2018-12-11 MED ORDER — NAPROXEN 500 MG PO TABS: 500.0000 mg | ORAL_TABLET | Freq: Two times a day (BID) | ORAL | 0 refills | Status: DC

## 2018-12-11 MED ORDER — CLINDAMYCIN PHOSPHATE 600 MG/50ML IV SOLN
600.0000 mg | Freq: Once | INTRAVENOUS | Status: AC
  Administered 2018-12-11: 600 mg via INTRAVENOUS
  Filled 2018-12-11: qty 50

## 2018-12-11 MED ORDER — CLINDAMYCIN HCL 300 MG PO CAPS: 300.0000 mg | ORAL_CAPSULE | Freq: Three times a day (TID) | ORAL | 0 refills | Status: AC
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
## 2018-12-11 NOTE — Discharge Instructions (Signed)
Take the medication as prescribed and until finished.  Come back here in 2 days for a recheck. Come back sooner if the area gets worse.

## 2018-12-11 NOTE — ED Triage Notes (Signed)
Abscess L thigh x 3 days, no drainage yet.

## 2018-12-11 NOTE — ED Notes (Signed)
Reference triage note. Area to left upper thigh noted to be red and warm to touch. Pt states area has been this way for three days. Pt denies any fevers at home. Area tender to touch.

## 2018-12-20 ENCOUNTER — Emergency Department
Admission: EM | Admit: 2018-12-20 | Discharge: 2018-12-20 | Disposition: A | Discharge status: Home / Self Care | Payer: Self-pay | Attending: Emergency Medicine | Admitting: Emergency Medicine

## 2018-12-20 ENCOUNTER — Encounter: Payer: Self-pay | Admitting: Emergency Medicine

## 2018-12-20 ENCOUNTER — Other Ambulatory Visit: Payer: Self-pay

## 2018-12-20 DIAGNOSIS — Z5189 Encounter for other specified aftercare: Secondary | ICD-10-CM | POA: Insufficient documentation

## 2018-12-20 DIAGNOSIS — L03116 Cellulitis of left lower limb: Secondary | ICD-10-CM | POA: Insufficient documentation

## 2018-12-20 NOTE — ED Provider Notes (Signed)
[facility] Emergency Department Provider Note  ____________________________________________  Time seen: Approximately 6:22 PM  I have reviewed the triage vital signs and the nursing notes.   HISTORY  Chief Complaint Wound Check   HPI Lindsay Frye is a 53 y.o. female who presents to the emergency department for a wound check. She was seen here 2 days ago and diagnosed with cellulitis. She states the area feels better and has improved. She has missed a couple of doses of antibiotic.   History reviewed. No pertinent past medical history.  There are no active problems to display for this patient.   Past Surgical History:  Procedure Laterality Date  . ABDOMINAL SURGERY    . skull plate      Prior to Admission medications   Medication Sig Start Date End Date Taking? Authorizing Provider  benzonatate (TESSALON PERLES) 100 MG capsule Take 1 capsule (100 mg total) by mouth 3 (three) times daily as needed for cough. 04/18/17   Joni Reining, PA-C  chlorpheniramine-HYDROcodone (TUSSIONEX PENNKINETIC ER) 10-8 MG/5ML SUER Take 5 mLs by mouth 2 (two) times daily. 04/18/17   Joni Reining, PA-C  clindamycin (CLEOCIN) 300 MG capsule Take 1 capsule (300 mg total) by mouth 3 (three) times daily for 10 days. 12/11/18 12/21/18  Camree Wigington, Rulon Eisenmenger B, FNP  furosemide (LASIX) 20 MG tablet Take 1 tablet (20 mg total) by mouth daily. 05/31/18 05/31/19  Emily Filbert, MD  HYDROcodone-acetaminophen (NORCO/VICODIN) 5-325 MG tablet Take 1 tablet by mouth every 4 (four) hours as needed for moderate pain. 10/17/15   Tommi Rumps, PA-C  ibuprofen (ADVIL,MOTRIN) 600 MG tablet Take 1 tablet (600 mg total) by mouth every 6 (six) hours as needed. 07/20/17   Enid Derry, PA-C  ibuprofen (ADVIL,MOTRIN) 800 MG tablet Take 1 tablet (800 mg total) by mouth every 8 (eight) hours as needed. 05/31/18   Emily Filbert, MD  naproxen (NAPROSYN) 500 MG tablet Take 1 tablet (500 mg total)  by mouth 2 (two) times daily with a meal. 12/11/18   Adreanna Fickel B, FNP    Allergies Patient has no known allergies.  No family history on file.  Social History Social History   Tobacco Use  . Smoking status: Never Smoker  . Smokeless tobacco: Never Used  Substance Use Topics  . Alcohol use: No  . Drug use: No    Review of Systems  Constitutional: Negative for fever. Respiratory: Negative for cough or shortness of breath.  Musculoskeletal: Negative for myalgias Skin: Positive for left leg erythema. Neurological: Negative for numbness or paresthesias. ____________________________________________   PHYSICAL EXAM:  VITAL SIGNS: ED Triage Vitals [12/20/18 1454]  Enc Vitals Group     BP 131/71     Pulse Rate 80     Resp 16     Temp 98.2 F (36.8 C)     Temp Source Oral     SpO2 99 %     Weight      Height      Head Circumference      Peak Flow      Pain Score 2     Pain Loc      Pain Edu?      Excl. in GC?      Constitutional: Well appearing. Eyes: Conjunctivae are clear without discharge or drainage. Nose: No rhinorrhea noted. Mouth/Throat: Airway is patent.  Neck: No stridor. Unrestricted range of motion observed. Cardiovascular: Capillary refill is <3 seconds.  Respiratory: Respirations are  even and unlabored.. Musculoskeletal: Unrestricted range of motion observed. Neurologic: Awake, alert, and oriented x 4.  Skin: Erythematous area marked 2 days ago is now significantly smaller.  There is a central area that appears to have been draining spontaneously.  ____________________________________________   LABS (all labs ordered are listed, but only abnormal results are displayed)  Labs Reviewed - No data to display ____________________________________________  EKG  Not indicated. ____________________________________________  RADIOLOGY  Not  indicated ____________________________________________   PROCEDURES  Procedures ____________________________________________   INITIAL IMPRESSION / ASSESSMENT AND PLAN / ED COURSE  Lindsay Frye is a 53 y.o. female presents to the emergency department for cellulitis check.  She seems to be improving as expected and was encouraged not to miss any additional doses of her antibiotic.  She was encouraged to finish the antibiotic.  She is to return to the emergency department if she feels the area is getting worse.   Medications - No data to display   Pertinent labs & imaging results that were available during my care of the patient were reviewed by me and considered in my medical decision making (see chart for details).  ____________________________________________   FINAL CLINICAL IMPRESSION(S) / ED DIAGNOSES  Final diagnoses:  Visit for wound check  Cellulitis of left lower extremity    ED Discharge Orders    None       Note:  This document was prepared using Dragon voice recognition software and may include unintentional dictation errors.    Chinita Pester, FNP 12/20/18 1827    Minna Antis, MD 12/20/18 Corky Crafts

## 2018-12-20 NOTE — ED Notes (Signed)
See triage note  Presents with wound to left upper leg  States she was seen couple of days ago  And placed on antibiotics    Area remains red  But less tender on arrival

## 2018-12-20 NOTE — ED Triage Notes (Signed)
Pt was seen here x2days ago for cellulitis on RT knee, was told to come back for recheck. Denies any drainage. Pt taking Clindamycin, states she miss " a few of them". VSS

## 2019-06-04 ENCOUNTER — Emergency Department
Admission: EM | Admit: 2019-06-04 | Discharge: 2019-06-04 | Discharge status: Against Medical Advice | Payer: Self-pay | Attending: Emergency Medicine | Admitting: Emergency Medicine

## 2019-06-04 ENCOUNTER — Other Ambulatory Visit: Payer: Self-pay

## 2019-06-04 ENCOUNTER — Encounter: Payer: Self-pay | Admitting: Emergency Medicine

## 2019-06-04 ENCOUNTER — Emergency Department: Payer: Self-pay

## 2019-06-04 DIAGNOSIS — Y929 Unspecified place or not applicable: Secondary | ICD-10-CM | POA: Insufficient documentation

## 2019-06-04 DIAGNOSIS — Y999 Unspecified external cause status: Secondary | ICD-10-CM | POA: Insufficient documentation

## 2019-06-04 DIAGNOSIS — Z5321 Procedure and treatment not carried out due to patient leaving prior to being seen by health care provider: Secondary | ICD-10-CM | POA: Insufficient documentation

## 2019-06-04 DIAGNOSIS — W010XXA Fall on same level from slipping, tripping and stumbling without subsequent striking against object, initial encounter: Secondary | ICD-10-CM | POA: Insufficient documentation

## 2019-06-04 DIAGNOSIS — Y9389 Activity, other specified: Secondary | ICD-10-CM | POA: Insufficient documentation

## 2019-06-04 NOTE — ED Notes (Signed)
Unable to locate pt  
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
## 2019-06-04 NOTE — ED Notes (Signed)
Pt called from lobby to go to CT scan. Unable to locate pt.

## 2019-10-10 ENCOUNTER — Other Ambulatory Visit: Payer: Self-pay

## 2019-10-10 ENCOUNTER — Emergency Department
Admission: EM | Admit: 2019-10-10 | Discharge: 2019-10-10 | Disposition: A | Discharge status: Home / Self Care | Payer: Self-pay | Attending: Emergency Medicine | Admitting: Emergency Medicine

## 2019-10-10 ENCOUNTER — Emergency Department: Payer: Self-pay

## 2019-10-10 DIAGNOSIS — S46911A Strain of unspecified muscle, fascia and tendon at shoulder and upper arm level, right arm, initial encounter: Secondary | ICD-10-CM

## 2019-10-10 DIAGNOSIS — S61012A Laceration without foreign body of left thumb without damage to nail, initial encounter: Secondary | ICD-10-CM | POA: Insufficient documentation

## 2019-10-10 DIAGNOSIS — S46811A Strain of other muscles, fascia and tendons at shoulder and upper arm level, right arm, initial encounter: Secondary | ICD-10-CM | POA: Insufficient documentation

## 2019-10-10 DIAGNOSIS — F17210 Nicotine dependence, cigarettes, uncomplicated: Secondary | ICD-10-CM | POA: Insufficient documentation

## 2019-10-10 DIAGNOSIS — Y999 Unspecified external cause status: Secondary | ICD-10-CM | POA: Insufficient documentation

## 2019-10-10 DIAGNOSIS — Y92524 Gas station as the place of occurrence of the external cause: Secondary | ICD-10-CM | POA: Insufficient documentation

## 2019-10-10 DIAGNOSIS — S5012XA Contusion of left forearm, initial encounter: Secondary | ICD-10-CM | POA: Insufficient documentation

## 2019-10-10 DIAGNOSIS — Y9389 Activity, other specified: Secondary | ICD-10-CM | POA: Insufficient documentation

## 2019-10-10 NOTE — ED Notes (Signed)
[employer] officer has finished interviewing pt.
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
## 2019-10-10 NOTE — Discharge Instructions (Addendum)
Fortunately the wound on your left arm did not break the skin and you do not need medications for it.  I recommend that you use gentle pressure with an Ace wrap and use cold therapy.  Please see the included information for more details.  Regarding your shoulder, you can use the provided sling as needed for comfort and try to minimize the use of that arm.  I provided the name and number of an orthopedic surgeon with whom he can follow-up in about a week if you are still having symptoms.  Please continue to take your usual medications and use over-the-counter ibuprofen and/or Tylenol according to label instructions as needed for comfort.
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
## 2019-11-29 ENCOUNTER — Emergency Department
Admission: EM | Admit: 2019-11-29 | Discharge: 2019-11-29 | Disposition: A | Discharge status: Home / Self Care | Payer: Self-pay | Attending: Emergency Medicine | Admitting: Emergency Medicine

## 2019-11-29 ENCOUNTER — Other Ambulatory Visit: Payer: Self-pay

## 2019-11-29 DIAGNOSIS — K047 Periapical abscess without sinus: Secondary | ICD-10-CM | POA: Insufficient documentation

## 2019-11-29 DIAGNOSIS — K029 Dental caries, unspecified: Secondary | ICD-10-CM | POA: Insufficient documentation

## 2019-11-29 DIAGNOSIS — F17200 Nicotine dependence, unspecified, uncomplicated: Secondary | ICD-10-CM | POA: Insufficient documentation

## 2019-11-29 DIAGNOSIS — R220 Localized swelling, mass and lump, head: Secondary | ICD-10-CM | POA: Insufficient documentation

## 2019-11-29 MED ORDER — AMOXICILLIN 500 MG PO CAPS: 500.0000 mg | ORAL_CAPSULE | Freq: Three times a day (TID) | ORAL | 0 refills | Status: DC

## 2019-11-29 MED ORDER — HYDROCODONE-ACETAMINOPHEN 5-325 MG PO TABS
1.0000 | ORAL_TABLET | Freq: Once | ORAL | Status: AC
Start: 2019-11-29 — End: 2019-11-29
  Administered 2019-11-29: 18:00:00 1 via ORAL
  Filled 2019-11-29: qty 1

## 2019-11-29 MED ORDER — AMOXICILLIN 500 MG PO CAPS
500.0000 mg | ORAL_CAPSULE | Freq: Once | ORAL | Status: AC
  Administered 2019-11-29: 18:00:00 500 mg via ORAL
  Filled 2019-11-29: qty 1

## 2019-11-29 MED ORDER — LIDOCAINE-EPINEPHRINE 2 %-1:100000 IJ SOLN
1.7000 mL | Freq: Once | INTRAMUSCULAR | Status: AC
  Administered 2019-11-29: 1.7 mL
  Filled 2019-11-29: qty 1.7

## 2019-11-29 MED ORDER — ACETAMINOPHEN-CODEINE #3 300-30 MG PO TABS: 1.0000 | ORAL_TABLET | Freq: Three times a day (TID) | ORAL | 0 refills | Status: DC | PRN

## 2019-11-29 NOTE — ED Triage Notes (Signed)
Dental pain to upper front right tooth-broke. Swelling to upper gums as well, possible abscess. Lip swelling to top lip only. Pt has taken mediations for pain OTC and penicillin that she got at a store. No relief. Pt alert and oriented X4, cooperative, RR even and unlabored, color WNL. Pt in NAD.

## 2019-11-29 NOTE — ED Provider Notes (Signed)
[facility] Emergency Department Provider Note ____________________________________________  Time seen: 1733  I have reviewed the triage vital signs and the nursing notes.  HISTORY  Chief Complaint  Dental Problem  HPI Lindsay Frye is a 54 y.o. female presents.  To the ED for evaluation of dental infection and facial swelling.  Patient describes awaking yesterday with sudden swelling to the anterior jaw over the right primary molar.  She admits to a chronically broken tooth there, and describes swelling at the base of that tooth.  Patient describes fullness and swelling from the nasolabial fold, causing the upper lip to protrude.  She denies any spontaneous purulent drainage.  She also denies any difficulty swallowing or controlling secretions.  She is not  endorsing any fevers, chills, or sweats.  She did take 2 doses of an over-the-counter amoxicillin she bought from a local [employer]ga yesterday.  History reviewed. No pertinent past medical history.  There are no problems to display for this patient.   Past Surgical History:  Procedure Laterality Date  . ABDOMINAL SURGERY    . skull plate      Prior to Admission medications   Medication Sig Start Date End Date Taking? Authorizing Provider  amoxicillin (AMOXIL) 500 MG capsule Take 1 capsule (500 mg total) by mouth 3 (three) times daily. 11/30/19   Andrzej Scully, Charlesetta Ivory, PA-C  furosemide (LASIX) 20 MG tablet Take 1 tablet (20 mg total) by mouth daily. 05/31/18 05/31/19  Emily Filbert, MD    Allergies Patient has no known allergies.  History reviewed. No pertinent family history.  Social History Social History   Tobacco Use  . Smoking status: Current Every Day Smoker  . Smokeless tobacco: Never Used  Substance Use Topics  . Alcohol use: Yes  . Drug use: No    Review of Systems  Constitutional: Negative for fever. Eyes: Negative for visual changes. ENT: Negative for sore throat.  Gum  swelling as above. Cardiovascular: Negative for chest pain. Respiratory: Negative for shortness of breath. Gastrointestinal: Negative for abdominal pain, vomiting and diarrhea. Genitourinary: Negative for dysuria. Musculoskeletal: Negative for back pain. Skin: Negative for rash. Neurological: Negative for headaches, focal weakness or numbness. ____________________________________________  PHYSICAL EXAM:  VITAL SIGNS: ED Triage Vitals  Enc Vitals Group     BP 11/29/19 1707 101/71     Pulse Rate 11/29/19 1707 74     Resp 11/29/19 1707 20     Temp 11/29/19 1707 98.7 F (37.1 C)     Temp Source 11/29/19 1707 Oral     SpO2 11/29/19 1707 100 %     Weight 11/29/19 1707 200 lb (90.7 kg)     Height 11/29/19 1707 5\' 7"  (1.702 m)     Head Circumference --      Peak Flow --      Pain Score 11/29/19 1711 10     Pain Loc --      Pain Edu? --      Excl. in GC? --     Constitutional: Alert and oriented. Well appearing and in no distress. Head: Normocephalic and atraumatic. Eyes: Conjunctivae are normal. Normal extraocular movements Ears: Canals clear. TMs intact bilaterally. Nose: No congestion/rhinorrhea/epistaxis. Mouth/Throat: Mucous membranes are moist.  Patient with obvious soft tissue swelling and protrusion of the upper lip secondary to a focal abscess to the buccal mucosa at the apical process of her right primary incisor upper jaw.  Patient with a chronically broken right primary incisor to the root.  No oropharyngeal erythema, edema, or tonsillitis is appreciated.  No brawny sublingual edema noted. Neck: Supple. No thyromegaly. Hematological/Lymphatic/Immunological: No cervical lymphadenopathy. Cardiovascular: Normal rate, regular rhythm. Normal distal pulses. Respiratory: Normal respiratory effort.  Musculoskeletal: Nontender with normal range of motion in all extremities.  Neurologic:  Normal gait without ataxia. Normal speech and language. No gross focal neurologic deficits  are appreciated. Skin:  Skin is warm, dry and intact. No rash noted. Psychiatric: Mood and affect are normal. Patient exhibits appropriate insight and judgment. ____________________________________________  PROCEDURES  Amoxicillin 500 mg PO Norco 5-325 mg PO  .Marland KitchenIncision and Drainage  Date/Time: 11/29/2019 5:43 PM Performed by: Melvenia Needles, PA-C Authorized by: Melvenia Needles, PA-C   Consent:    Consent obtained:  Verbal   Consent given by:  Patient   Risks discussed:  Bleeding, pain and incomplete drainage   Alternatives discussed:  Alternative treatment Location:    Type:  Abscess   Location:  Mouth   Mouth location:  Alveolar process Anesthesia (see MAR for exact dosages):    Anesthesia method:  Local infiltration   Local anesthetic:  Lidocaine 2% WITH epi Procedure type:    Complexity:  Simple Procedure details:    Incision types:  Stab incision   Incision depth:  Subcutaneous   Scalpel blade:  11   Drainage:  Purulent   Drainage amount:  Moderate   Wound treatment:  Wound left open   Packing materials:  None Post-procedure details:    Patient tolerance of procedure:  Tolerated well, no immediate complications   ____________________________________________  INITIAL IMPRESSION / ASSESSMENT AND PLAN / ED COURSE  Patient with ED evaluation of acute swelling to the face at the base of the right primary incisor.  Patient with a chronically broken tooth and focal gum swelling at the periapical mucosa.  A successful local I&D procedures performed and the patient is discharged home with a prescription for amoxicillin.  She will follow up with a local dental provider for definitive management.  Return precautions have been reviewed.  Lindsay Frye was evaluated in Emergency Department on 11/29/2019 for the symptoms described in the history of present illness. She was evaluated in the context of the global COVID-19 pandemic, which necessitated consideration  that the patient might be at risk for infection with the SARS-CoV-2 virus that causes COVID-19. Institutional protocols and algorithms that pertain to the evaluation of patients at risk for COVID-19 are in a state of rapid change based on information released by regulatory bodies including the CDC and federal and state organizations. These policies and algorithms were followed during the patient's care in the ED. ____________________________________________  FINAL CLINICAL IMPRESSION(S) / ED DIAGNOSES  Final diagnoses:  Dental abscess  Dental caries      Melvenia Needles, PA-C 11/29/19 1901    Vanessa [place], MD 11/29/19 2255

## 2019-11-29 NOTE — ED Notes (Signed)
See triage note  Presents with dental pain  Presents with broken tooth to upper front several months ago  But developed gum swelling and upper lip swelling for the past 3 days

## 2019-11-29 NOTE — Discharge Instructions (Addendum)
You are being treated for a dental abscess. Take the antibiotic as directed and the pain medicine as needed. Rinse daily with warm salty water. Follow-up with [facility] for dental extractions.

## 2020-05-09 ENCOUNTER — Telehealth: Payer: Self-pay | Admitting: General Practice

## 2020-05-09 NOTE — Telephone Encounter (Signed)
Pt said she already has insurance and is therefore not interested so no further contact will be made

## 2020-10-21 ENCOUNTER — Other Ambulatory Visit: Payer: Self-pay

## 2020-10-21 ENCOUNTER — Encounter: Payer: Self-pay | Admitting: Emergency Medicine

## 2020-10-21 ENCOUNTER — Emergency Department: Payer: Self-pay

## 2020-10-21 ENCOUNTER — Emergency Department
Admission: EM | Admit: 2020-10-21 | Discharge: 2020-10-21 | Disposition: A | Discharge status: Home / Self Care | Payer: Self-pay | Attending: Emergency Medicine | Admitting: Emergency Medicine

## 2020-10-21 DIAGNOSIS — Z23 Encounter for immunization: Secondary | ICD-10-CM | POA: Insufficient documentation

## 2020-10-21 DIAGNOSIS — F17200 Nicotine dependence, unspecified, uncomplicated: Secondary | ICD-10-CM | POA: Insufficient documentation

## 2020-10-21 DIAGNOSIS — L03011 Cellulitis of right finger: Secondary | ICD-10-CM | POA: Insufficient documentation

## 2020-10-21 MED ORDER — OXYCODONE-ACETAMINOPHEN 7.5-325 MG PO TABS
1.0000 | ORAL_TABLET | Freq: Once | ORAL | Status: AC
  Administered 2020-10-21: 12:00:00 1 via ORAL
  Filled 2020-10-21: qty 1

## 2020-10-21 MED ORDER — OXYCODONE-ACETAMINOPHEN 5-325 MG PO TABS: 1.0000 | ORAL_TABLET | Freq: Four times a day (QID) | ORAL | 0 refills | Status: DC | PRN

## 2020-10-21 MED ORDER — CEPHALEXIN 500 MG PO CAPS
500.0000 mg | ORAL_CAPSULE | Freq: Once | ORAL | Status: AC
  Administered 2020-10-21: 15:00:00 500 mg via ORAL
  Filled 2020-10-21: qty 1

## 2020-10-21 MED ORDER — TETANUS-DIPHTH-ACELL PERTUSSIS 5-2.5-18.5 LF-MCG/0.5 IM SUSY
0.5000 mL | PREFILLED_SYRINGE | Freq: Once | INTRAMUSCULAR | Status: AC
  Administered 2020-10-21: 15:00:00 0.5 mL via INTRAMUSCULAR
  Filled 2020-10-21: qty 0.5

## 2020-10-21 MED ORDER — LIDOCAINE HCL (PF) 1 % IJ SOLN
INTRAMUSCULAR | Status: AC
  Administered 2020-10-21: 12:00:00 5 mL
  Filled 2020-10-21: qty 5

## 2020-10-21 MED ORDER — CEPHALEXIN 500 MG PO CAPS: 500.0000 mg | ORAL_CAPSULE | Freq: Three times a day (TID) | ORAL | 0 refills | Status: DC

## 2020-10-21 MED ORDER — LIDOCAINE HCL (PF) 1 % IJ SOLN
5.0000 mL | Freq: Once | INTRAMUSCULAR | Status: AC
  Administered 2020-10-21: 12:00:00 5 mL
  Filled 2020-10-21: qty 5

## 2020-10-21 NOTE — Discharge Instructions (Signed)
Follow-up with urgent care or [facility] acute care if any continued problems.  Leave the dressing on for 2 days and then removed.  Begin soaking your finger and warm water and do it 4-5 times per day.  Begin taking antibiotics until completely finished.  This medication is 3 times a day with the first 1 being here in the emergency department.  The pain medication and antibiotic was sent to your pharmacy at [facility].  Return to the emergency department if any severe worsening of your symptoms are not improving.

## 2020-10-21 NOTE — ED Provider Notes (Signed)
[facility] Emergency Department Provider Note   ____________________________________________   Event Date/Time   First MD Initiated Contact with Patient 10/21/20 1112     (approximate)  I have reviewed the triage vital signs and the nursing notes.   HISTORY  Chief Complaint Wound Infection   HPI Lindsay Frye is a 54 y.o. female presents to the ED with complaint of right third finger pain.  Patient states that she used a insulin needle and opened up her finger yesterday and got pus out of it.  She states today that it continues to be swollen and painful.  She denies any previous injury to her finger.  Rates her pain as 5 out of 10.       History reviewed. No pertinent past medical history.  There are no problems to display for this patient.   Past Surgical History:  Procedure Laterality Date  . ABDOMINAL SURGERY    . skull plate      Prior to Admission medications   Medication Sig Start Date End Date Taking? Authorizing Provider  cephALEXin (KEFLEX) 500 MG capsule Take 1 capsule (500 mg total) by mouth 3 (three) times daily. 10/21/20   Tommi Rumps, PA-C  furosemide (LASIX) 20 MG tablet Take 1 tablet (20 mg total) by mouth daily. 05/31/18 05/31/19  Emily Filbert, MD  oxyCODONE-acetaminophen (PERCOCET) 5-325 MG tablet Take 1 tablet by mouth every 6 (six) hours as needed for severe pain. 10/21/20   Tommi Rumps, PA-C    Allergies Patient has no known allergies.  History reviewed. No pertinent family history.  Social History Social History   Tobacco Use  . Smoking status: Current Every Day Smoker  . Smokeless tobacco: Never Used  Substance Use Topics  . Alcohol use: Yes  . Drug use: No    Review of Systems Constitutional: No fever/chills Eyes: No visual changes. Cardiovascular: Denies chest pain. Respiratory: Denies shortness of breath. Gastrointestinal:  No nausea, no vomiting. Musculoskeletal: Negative for back  pain. Skin: Pain lateral right third digit. Neurological: Negative for headaches, focal weakness or numbness. ____________________________________________   PHYSICAL EXAM:  VITAL SIGNS: ED Triage Vitals  Enc Vitals Group     BP 10/21/20 0342 128/90     Pulse Rate 10/21/20 0342 70     Resp 10/21/20 0342 17     Temp 10/21/20 0342 98 F (36.7 C)     Temp Source 10/21/20 0342 Oral     SpO2 10/21/20 0342 97 %     Weight 10/21/20 0340 220 lb (99.8 kg)     Height 10/21/20 0340 5\' 6"  (1.676 m)     Head Circumference --      Peak Flow --      Pain Score 10/21/20 0339 5     Pain Loc --      Pain Edu? --      Excl. in GC? --    Constitutional: Alert and oriented. Well appearing and in no acute distress. Eyes: Conjunctivae are normal. PERRL. EOMI. Head: Atraumatic. Neck: No stridor.   Cardiovascular: Normal rate, regular rhythm. Grossly normal heart sounds.  Good peripheral circulation. Respiratory: Normal respiratory effort.  No retractions. Lungs CTAB. Musculoskeletal: Examination of the right third digit distally on the lateral aspect there is what appears to be a paronychial infection and skin is fluctuant.  No open area or active drainage at this time.  Area is moderately tender to palpation. Neurologic:  Normal speech and language. No gross  focal neurologic deficits are appreciated. No gait instability. Skin:  Skin is warm, dry and intact.  Paronychia as noted above. Psychiatric: Mood and affect are normal. Speech and behavior are normal.  ____________________________________________   LABS (all labs ordered are listed, but only abnormal results are displayed)  Labs Reviewed - No data to display ____________________________________________  RADIOLOGY I, Tommi Rumps, personally viewed and evaluated these images (plain radiographs) as part of my medical decision making, as well as reviewing the written report by the radiologist.   Official radiology report(s): DG  Finger Middle Right  Result Date: 10/21/2020 CLINICAL DATA:  Patient with swelling of the third digit. EXAM: RIGHT MIDDLE FINGER 2+V COMPARISON:  None. FINDINGS: Normal anatomic alignment. No evidence for acute fracture or dislocation. No cortical osseous erosion. No soft tissue gas. IMPRESSION: No cortical osseous erosion or soft tissue gas. Electronically Signed   By: Annia Belt M.D.   On: 10/21/2020 12:13    ____________________________________________   PROCEDURES  Procedure(s) performed (including Critical Care):  Marland KitchenMarland KitchenIncision and Drainage  Date/Time: 10/21/2020 1:08 PM Performed by: Tommi Rumps, PA-C Authorized by: Tommi Rumps, PA-C   Consent:    Consent obtained:  Verbal   Consent given by:  Patient   Risks, benefits, and alternatives were discussed: yes     Risks discussed:  Incomplete drainage and infection   Alternatives discussed:  No treatment Universal protocol:    Procedure explained and questions answered to patient or proxy's satisfaction: yes   Location:    Type:  Abscess   Location:  Upper extremity   Upper extremity location:  Finger   Finger location:  R long finger Pre-procedure details:    Skin preparation:  Antiseptic wash Sedation:    Sedation type:  None Anesthesia:    Anesthesia method:  Nerve block   Block needle gauge:  25 G   Block anesthetic:  Lidocaine 1% w/o epi   Block injection procedure:  Anatomic landmarks identified, introduced needle, incremental injection and negative aspiration for blood   Block outcome:  Anesthesia achieved Procedure type:    Complexity:  Simple Procedure details:    Incision types:  Single straight   Incision depth:  Subcutaneous   Drainage:  Purulent   Drainage amount:  Moderate   Wound treatment:  Wound left open and drain placed   Packing materials:  1/4 in iodoform gauze Post-procedure details:    Procedure completion:   Tolerated     ____________________________________________   INITIAL IMPRESSION / ASSESSMENT AND PLAN / ED COURSE  As part of my medical decision making, I reviewed the following data within the electronic MEDICAL RECORD NUMBER Notes from prior ED visits and [place] Controlled Substance Database  54 year old female presents to the ED with a paronychial infection of her right third digit.  Area was opened at a moderate amount of purulent drainage was removed.  A very small piece of 1/4 inch iodoform gauze was placed to keep the area open to drain.  Patient will leave the dressing on for 2 days and then remove and begin soaking in warm water.  She is to follow-up with urgent care or return to the emergency department if any continued problems.  Patient was given Keflex while in the emergency department along with a prescription for continued Keflex and Percocet if needed for pain.  ____________________________________________   FINAL CLINICAL IMPRESSION(S) / ED DIAGNOSES  Final diagnoses:  Paronychia of right middle finger     ED Discharge Orders  Ordered    cephALEXin (KEFLEX) 500 MG capsule  3 times daily        10/21/20 1415    oxyCODONE-acetaminophen (PERCOCET) 5-325 MG tablet  Every 6 hours PRN        10/21/20 1415          *Please note:  DANELLA PHILSON was evaluated in Emergency Department on 10/21/2020 for the symptoms described in the history of present illness. She was evaluated in the context of the global COVID-19 pandemic, which necessitated consideration that the patient might be at risk for infection with the SARS-CoV-2 virus that causes COVID-19. Institutional protocols and algorithms that pertain to the evaluation of patients at risk for COVID-19 are in a state of rapid change based on information released by regulatory bodies including the CDC and federal and state organizations. These policies and algorithms were followed during the patient's care in the ED.  Some ED  evaluations and interventions may be delayed as a result of limited staffing during and the pandemic.*   Note:  This document was prepared using Dragon voice recognition software and may include unintentional dictation errors.    Tommi Rumps, PA-C 10/21/20 1503    Shaune Pollack, MD 10/21/20 [id number]

## 2020-10-21 NOTE — ED Triage Notes (Signed)
Patient reports right 3rd finger with swelling for the past week.  Patient reports she poked it on Saturday and got a lot of drainage out of it.  Right 3rd finger with swelling noted.

## 2020-10-24 ENCOUNTER — Emergency Department: Payer: Self-pay

## 2020-10-24 ENCOUNTER — Other Ambulatory Visit: Payer: Self-pay

## 2020-10-24 ENCOUNTER — Inpatient Hospital Stay
Admission: EM | Admit: 2020-10-24 | Discharge: 2020-10-26 | DRG: 514 | Disposition: A | Discharge status: Home / Self Care | Payer: Self-pay | Attending: Internal Medicine | Admitting: Internal Medicine

## 2020-10-24 DIAGNOSIS — M869 Osteomyelitis, unspecified: Principal | ICD-10-CM

## 2020-10-24 DIAGNOSIS — F17200 Nicotine dependence, unspecified, uncomplicated: Secondary | ICD-10-CM | POA: Diagnosis present

## 2020-10-24 DIAGNOSIS — Z23 Encounter for immunization: Secondary | ICD-10-CM

## 2020-10-24 DIAGNOSIS — Z20822 Contact with and (suspected) exposure to covid-19: Secondary | ICD-10-CM | POA: Diagnosis present

## 2020-10-24 DIAGNOSIS — L089 Local infection of the skin and subcutaneous tissue, unspecified: Secondary | ICD-10-CM

## 2020-10-24 DIAGNOSIS — M868X4 Other osteomyelitis, hand: Secondary | ICD-10-CM

## 2020-10-24 LAB — CBC WITH DIFFERENTIAL/PLATELET
Abs Immature Granulocytes: 0.01 10*3/uL (ref 0.00–0.07)
Basophils Absolute: 0 10*3/uL (ref 0.0–0.1)
Basophils Relative: 1 %
Eosinophils Absolute: 0.1 10*3/uL (ref 0.0–0.5)
Eosinophils Relative: 2 %
HCT: 40.4 % (ref 36.0–46.0)
Hemoglobin: 13.4 g/dL (ref 12.0–15.0)
Immature Granulocytes: 0 %
Lymphocytes Relative: 41 %
Lymphs Abs: 2.1 10*3/uL (ref 0.7–4.0)
MCH: 32.1 pg (ref 26.0–34.0)
MCHC: 33.2 g/dL (ref 30.0–36.0)
MCV: 96.7 fL (ref 80.0–100.0)
Monocytes Absolute: 0.5 10*3/uL (ref 0.1–1.0)
Monocytes Relative: 10 %
Neutro Abs: 2.4 10*3/uL (ref 1.7–7.7)
Neutrophils Relative %: 46 %
Platelets: 220 10*3/uL (ref 150–400)
RBC: 4.18 MIL/uL (ref 3.87–5.11)
RDW: 12.9 % (ref 11.5–15.5)
WBC: 5.2 10*3/uL (ref 4.0–10.5)
nRBC: 0 % (ref 0.0–0.2)

## 2020-10-24 LAB — BASIC METABOLIC PANEL
Anion gap: 6 (ref 5–15)
BUN: 13 mg/dL (ref 6–20)
CO2: 27 mmol/L (ref 22–32)
Calcium: 8.9 mg/dL (ref 8.9–10.3)
Chloride: 105 mmol/L (ref 98–111)
Creatinine, Ser: 0.79 mg/dL (ref 0.44–1.00)
GFR, Estimated: >60 mL/min (ref >60)
Glucose, Bld: 93 mg/dL (ref 70–99)
Potassium: 4.3 mmol/L (ref 3.5–5.1)
Sodium: 138 mmol/L (ref 135–145)

## 2020-10-24 LAB — RESP PANEL BY RT-PCR (FLU A&B, COVID) ARPGX2
Influenza A by PCR: NEGATIVE
Influenza B by PCR: NEGATIVE
SARS Coronavirus 2 by RT PCR: NEGATIVE

## 2020-10-24 MED ORDER — LIDOCAINE HCL (PF) 1 % IJ SOLN
5.0000 mL | Freq: Once | INTRAMUSCULAR | Status: AC
  Administered 2020-10-24: 18:00:00 5 mL via INTRADERMAL
  Filled 2020-10-24: qty 5

## 2020-10-24 MED ORDER — IBUPROFEN 400 MG PO TABS: 600.0000 mg | ORAL_TABLET | Freq: Four times a day (QID) | ORAL | Status: DC | PRN

## 2020-10-24 MED ORDER — ONDANSETRON HCL 4 MG PO TABS: 4.0000 mg | ORAL_TABLET | Freq: Four times a day (QID) | ORAL | Status: DC | PRN

## 2020-10-24 MED ORDER — ONDANSETRON HCL 4 MG/2ML IJ SOLN: 4.0000 mg | Freq: Four times a day (QID) | INTRAMUSCULAR | Status: DC | PRN

## 2020-10-24 MED ORDER — OXYCODONE-ACETAMINOPHEN 5-325 MG PO TABS
1.0000 | ORAL_TABLET | Freq: Once | ORAL | Status: AC
Start: 2020-10-24 — End: 2020-10-24
  Administered 2020-10-24: 21:00:00 1 via ORAL
  Filled 2020-10-24: qty 1

## 2020-10-24 MED ORDER — ACETAMINOPHEN 325 MG PO TABS: 650.0000 mg | ORAL_TABLET | Freq: Four times a day (QID) | ORAL | Status: DC | PRN

## 2020-10-24 MED ORDER — CLINDAMYCIN PHOSPHATE 600 MG/50ML IV SOLN
600.0000 mg | Freq: Once | INTRAVENOUS | Status: AC
  Administered 2020-10-24: 18:00:00 600 mg via INTRAVENOUS
  Filled 2020-10-24: qty 50

## 2020-10-24 MED ORDER — OXYCODONE HCL 5 MG PO TABS
5.0000 mg | ORAL_TABLET | ORAL | Status: DC | PRN
  Administered 2020-10-24 – 2020-10-26 (×5): 5 mg via ORAL
  Filled 2020-10-24 (×5): qty 1

## 2020-10-24 MED ORDER — ACETAMINOPHEN 650 MG RE SUPP: 650.0000 mg | Freq: Four times a day (QID) | RECTAL | Status: DC | PRN

## 2020-10-24 MED ORDER — ENOXAPARIN SODIUM 60 MG/0.6ML ~~LOC~~ SOLN: 0.5000 mg/kg | SUBCUTANEOUS | Status: DC

## 2020-10-24 MED ORDER — CLINDAMYCIN PHOSPHATE 600 MG/50ML IV SOLN
600.0000 mg | Freq: Three times a day (TID) | INTRAVENOUS | Status: DC
  Filled 2020-10-24 (×2): qty 50

## 2020-10-24 NOTE — ED Triage Notes (Signed)
Reports she was sent here for a wound check. Right hand 3rd digit. Pt had it drained 12/19 and prescribed ABX. States finger is looking better but needs someone to reevaluate it. Swelling present.

## 2020-10-24 NOTE — ED Provider Notes (Signed)
[facility] Emergency Department Provider Note  ____________________________________________  Time seen: Approximately 5:48 PM  I have reviewed the triage vital signs and the nursing notes.   HISTORY  Chief Complaint Wound Check    HPI Lindsay Frye is a 54 y.o. female that presents to the emergency department for reevaluation of infection to right third middle finger.  Patient was seen in this emergency department 3 days ago and diagnosed with a paronychia.  Paronychia was drained and packed.  She was started on Keflex, which she states she has been taking.  Packing is still in place.  Area is painful.  No fevers, chills, nausea, vomiting.  History reviewed. No pertinent past medical history.  Patient Active Problem List   Diagnosis Date Noted  . Infection of finger 10/24/2020  . 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 ABDOMINAL SURGERY    . skull plate      Prior to Admission medications   Medication Sig Start Date End Date Taking? Authorizing Provider  cephALEXin (KEFLEX) 500 MG capsule Take 1 capsule (500 mg total) by mouth 3 (three) times daily. 10/21/20  Yes Tommi Rumps, PA-C  oxyCODONE-acetaminophen (PERCOCET) 5-325 MG tablet Take 1 tablet by mouth every 6 (six) hours as needed for severe pain. 10/21/20  Yes Tommi Rumps, PA-C    Allergies Patient has no known allergies.  No family history on file.  Social History Social History   Tobacco Use  . Smoking status: Current Every Day Smoker  . Smokeless tobacco: Never Used  Substance Use Topics  . Alcohol use: Yes  . Drug use: No     Review of Systems  Cardiovascular: No chest pain. Respiratory: No SOB. Gastrointestinal: No abdominal pain.  No nausea, no vomiting.  Musculoskeletal: Positive for finger pain. Skin: Negative for rash, abrasions, lacerations, ecchymosis. Neurological: Negative for  headaches   ____________________________________________   PHYSICAL EXAM:  VITAL SIGNS: ED Triage Vitals  Enc Vitals Group     BP 10/24/20 1603 116/70     Pulse Rate 10/24/20 1603 74     Resp 10/24/20 1603 18     Temp 10/24/20 1603 99.2 F (37.3 C)     Temp Source 10/24/20 1603 Oral     SpO2 10/24/20 1603 100 %     Weight 10/24/20 1604 218 lb 4.1 oz (99 kg)     Height 10/24/20 1604 5\' 6"  (1.676 m)     Head Circumference --      Peak Flow --      Pain Score 10/24/20 1604 4     Pain Loc --      Pain Edu? --      Excl. in GC? --      Constitutional: Alert and oriented. Well appearing and in no acute distress. Eyes: Conjunctivae are normal. PERRL. EOMI. Head: Atraumatic. ENT:      Ears:      Nose: No congestion/rhinnorhea.      Mouth/Throat: Mucous membranes are moist.  Neck: No stridor.   Cardiovascular: Normal rate, regular rhythm.  Good peripheral circulation. Respiratory: Normal respiratory effort without tachypnea or retractions. Lungs CTAB. Good air entry to the bases with no decreased or absent breath sounds. Musculoskeletal: Full range of motion to all extremities. No gross deformities appreciated. Hard callus to right dorsal middle finger.  Mild tenderness to palpation of dorsal fingertip.  No fusiform swelling.  Full range of motion of finger.  Neurologic:  Normal speech and language. No gross focal neurologic deficits are appreciated.  Skin:  Skin is warm, dry. Small incision to lateral right middle finger. Psychiatric: Mood and affect are normal. Speech and behavior are normal. Patient exhibits appropriate insight and judgement.   ____________________________________________   LABS (all labs ordered are listed, but only abnormal results are displayed)  Labs Reviewed  RESP PANEL BY RT-PCR (FLU A&B, COVID) ARPGX2  CBC WITH DIFFERENTIAL/PLATELET  BASIC METABOLIC PANEL  HIV ANTIBODY (ROUTINE TESTING W REFLEX)  BASIC METABOLIC PANEL  CBC    ____________________________________________  EKG   ____________________________________________  RADIOLOGY Lexine Baton, personally viewed and evaluated these images (plain radiographs) as part of my medical decision making, as well as reviewing the written report by the radiologist.  DG Finger Middle Right  Result Date: 10/24/2020 CLINICAL DATA:  Right third digit infection, status post incision and drainage 10/21/2020 EXAM: RIGHT MIDDLE FINGER 2+V COMPARISON:  10/21/2020 FINDINGS: Frontal, oblique, lateral views of the right third digit are obtained. Soft tissue defect compatible with prior incision and drainage. There is diffuse edema of the third digit. There is bony resorption along the volar medial aspect of the distal tuft of the third distal phalanx diagnostic of osteomyelitis. No acute fractures. No radiopaque foreign body. IMPRESSION: 1. Osteomyelitis of the distal tuft of the third distal phalanx. 2. Diffuse soft tissue swelling of the third digit, with evidence of previous incision and drainage as above. Electronically Signed   By: Sharlet Salina M.D.   On: 10/24/2020 17:54    ____________________________________________    PROCEDURES  Procedure(s) performed:    Procedures   INCISION AND DRAINAGE Performed by: Enid Derry Consent: Verbal consent obtained. Risks and benefits: risks, benefits and alternatives were discussed Type: abscess  Body area: finger  Anesthesia: local infiltration  Incision was made with a scalpel.  Local anesthetic: lidocaine 1 % without epinephrine  Anesthetic total: 4 ml  Drainage: None  Packing material: 1/4 in iodoform gauze  Patient tolerance: Patient tolerated the procedure well with no immediate complications.    ____________________________________________   INITIAL IMPRESSION / ASSESSMENT AND PLAN / ED COURSE  Pertinent labs & imaging results that were available during my care of the patient were reviewed  by me and considered in my medical decision making (see chart for details).  Review of the [place] CSRS was performed in accordance of the [facility] prior to dispensing any controlled drugs.   Patient presented to the emergency department for evaluation of worsening fingertip infection.  Patient has a low-grade temp of 99.2 on arrival.  She does not have a leukocytosis.  She has a new osteomyelitis from x-ray 3 days prior.  On exam, she has packing material placed to her lateral finger.  There is swelling to the palmar surface with a large callus.  Dr. Erma Heritage was consulted and evaluated the patient.  He recommends attempted incision and drainage for possible felon.  Dr. Erma Heritage assisted with incision and drainage without any purulent drainage from procedure.  Dr. Rosita Kea was consulted and states that patient could trial another course of outpatient antibiotics or be admitted for IV antibiotics and orthopedic consultation.  Patient does not have insurance and likely does not have good follow-up and would be better to be admitted for IV antibiotics.  She was given a dose of IV Rocephin while in the emergency department.     BEDELIA PONG was evaluated in Emergency Department on 10/24/2020 for the symptoms described in the history  of present illness. She was evaluated in the context of the global COVID-19 pandemic, which necessitated consideration that the patient might be at risk for infection with the SARS-CoV-2 virus that causes COVID-19. Institutional protocols and algorithms that pertain to the evaluation of patients at risk for COVID-19 are in a state of rapid change based on information released by regulatory bodies including the CDC and federal and state organizations. These policies and algorithms were followed during the patient's care in the ED.   ____________________________________________  FINAL CLINICAL IMPRESSION(S) / ED DIAGNOSES  Final diagnoses:  Other osteomyelitis of right hand (HCC)  Finger  infection      NEW MEDICATIONS STARTED DURING THIS VISIT:  ED Discharge Orders    None          This chart was dictated using voice recognition software/Dragon. Despite best efforts to proofread, errors can occur which can change the meaning. Any change was purely unintentional.    Enid Derry, PA-C 10/24/20 2208    Shaune Pollack, MD 10/26/20 2241

## 2020-10-24 NOTE — H&P (Signed)
History and Physical    Lindsay Frye:[id number] DOB: July 24, 1966 DOA: 10/24/2020  PCP: Patient, No Pcp Per   Patient coming from: Home  I have personally briefly reviewed patient's old medical records in [facility]  Chief Complaint: Pain right middle finger  HPI: Lindsay Frye is a 54 y.o. female with no significant past medical history presenting to the emergency room with pain in my finger.  Patient was seen in the ER on 12/19 and had I&D of paronychia and started on Keflex however she states that the area has become increasingly painful.  Pain is throbbing and of severe intensity, unrelieved with OTC and prescription pain medication.  She denies fever or chills  ED Course: On arrival, BP 116/70, pulse 74, temp 99.2 and O2 sat 100% on room air.  CBC and BMP within normal limits.  X-ray of the digit showed osteomyelitis of the distal tuft of the third distal phalanx with diffuse soft tissue swelling of the fifth digit with evidence of previous incision and drainage.   The ER provider spoke with orthopedist, Dr. Rosita Kea who recommended admission to hospitalist service for IV antibiotics.    Review of Systems: As per HPI otherwise all other systems on review of systems negative.    History reviewed. No pertinent past medical history.  Past Surgical History:  Procedure Laterality Date  . ABDOMINAL SURGERY    . skull plate       reports that she has been smoking. She has never used smokeless tobacco. She reports current alcohol use. She reports that she does not use drugs.  No Known Allergies  History reviewed. No pertinent family history.    Prior to Admission medications   Medication Sig Start Date End Date Taking? Authorizing Provider  cephALEXin (KEFLEX) 500 MG capsule Take 1 capsule (500 mg total) by mouth 3 (three) times daily. 10/21/20  Yes Tommi Rumps, PA-C  oxyCODONE-acetaminophen (PERCOCET) 5-325 MG tablet Take 1 tablet by mouth every 6 (six) hours as  needed for severe pain. 10/21/20  Yes Tommi Rumps, PA-C    Physical Exam: Vitals:   10/24/20 1603 10/24/20 1604  BP: 116/70   Pulse: 74   Resp: 18   Temp: 99.2 F (37.3 C)   TempSrc: Oral   SpO2: 100%   Weight:  99 kg  Height:  5\' 6"  (1.676 m)     Vitals:   10/24/20 1603 10/24/20 1604  BP: 116/70   Pulse: 74   Resp: 18   Temp: 99.2 F (37.3 C)   TempSrc: Oral   SpO2: 100%   Weight:  99 kg  Height:  5\' 6"  (1.676 m)      Constitutional: Alert and oriented x 3 .  In apparent pain HEENT:      Head: Normocephalic and atraumatic.         Eyes: PERLA, EOMI, Conjunctivae are normal. Sclera is non-icteric.       Mouth/Throat: Mucous membranes are moist.       Neck: Supple with no signs of meningismus. Cardiovascular: Regular rate and rhythm. No murmurs, gallops, or rubs. 2+ symmetrical distal pulses are present .Respiratory: Respiratory effort normal .Lungs sounds clear bilaterally. No wheezes, crackles, or rhonchi.  Gastrointestinal: Soft, non tender, and non distended with positive bowel sounds.  Genitourinary: No CVA tenderness. Musculoskeletal:  Right third finger swollen, tender on palpation with half centimeter incision palmar aspect of distal third finger.  Musculoskeletal exam otherwise WNL. Neurologic:  Face is symmetric.  Moving all extremities. No gross focal neurologic deficits . Skin: Skin is warm, dry.  No rash or ulcers Psychiatric: Mood and affect are normal    Labs on Admission: I have personally reviewed following labs and imaging studies  CBC: Recent Labs  Lab 10/24/20 1816  WBC 5.2  NEUTROABS 2.4  HGB 13.4  HCT 40.4  MCV 96.7  PLT 220   Basic Metabolic Panel: Recent Labs  Lab 10/24/20 1816  NA 138  K 4.3  CL 105  CO2 27  GLUCOSE 93  BUN 13  CREATININE 0.79  CALCIUM 8.9   GFR: Estimated Creatinine Clearance: 96.5 mL/min (by C-G formula based on SCr of 0.79 mg/dL). Liver Function Tests: No results for input(s): AST, ALT,  ALKPHOS, BILITOT, PROT, ALBUMIN in the last 168 hours. No results for input(s): LIPASE, AMYLASE in the last 168 hours. No results for input(s): AMMONIA in the last 168 hours. Coagulation Profile: No results for input(s): INR, PROTIME in the last 168 hours. Cardiac Enzymes: No results for input(s): CKTOTAL, CKMB, CKMBINDEX, TROPONINI in the last 168 hours. BNP (last 3 results) No results for input(s): PROBNP in the last 8760 hours. HbA1C: No results for input(s): HGBA1C in the last 72 hours. CBG: No results for input(s): GLUCAP in the last 168 hours. Lipid Profile: No results for input(s): CHOL, HDL, LDLCALC, TRIG, CHOLHDL, LDLDIRECT in the last 72 hours. Thyroid Function Tests: No results for input(s): TSH, T4TOTAL, FREET4, T3FREE, THYROIDAB in the last 72 hours. Anemia Panel: No results for input(s): VITAMINB12, FOLATE, FERRITIN, TIBC, IRON, RETICCTPCT in the last 72 hours. Urine analysis:    Component Value Date/Time   COLORURINE Amber 07/04/2013 1352   APPEARANCEUR Hazy 07/04/2013 1352   LABSPEC 1.036 07/04/2013 1352   PHURINE 5.0 07/04/2013 1352   GLUCOSEU Negative 07/04/2013 1352   HGBUR Negative 07/04/2013 1352   BILIRUBINUR Negative 07/04/2013 1352   KETONESUR Negative 07/04/2013 1352   PROTEINUR 30 mg/dL 62/83/6629 4765   NITRITE Negative 07/04/2013 1352   LEUKOCYTESUR Negative 07/04/2013 1352    Radiological Exams on Admission: DG Finger Middle Right  Result Date: 10/24/2020 CLINICAL DATA:  Right third digit infection, status post incision and drainage 10/21/2020 EXAM: RIGHT MIDDLE FINGER 2+V COMPARISON:  10/21/2020 FINDINGS: Frontal, oblique, lateral views of the right third digit are obtained. Soft tissue defect compatible with prior incision and drainage. There is diffuse edema of the third digit. There is bony resorption along the volar medial aspect of the distal tuft of the third distal phalanx diagnostic of osteomyelitis. No acute fractures. No radiopaque  foreign body. IMPRESSION: 1. Osteomyelitis of the distal tuft of the third distal phalanx. 2. Diffuse soft tissue swelling of the third digit, with evidence of previous incision and drainage as above. Electronically Signed   By: Sharlet Salina M.D.   On: 10/24/2020 17:54     Assessment/Plan 54 year old female with no significant past medical history presenting with persistent pain distal right middle finger following I&D for paronychia on 12/19 and outpatient oral antibiotic treatment   Infection of finger  Finger osteomyelitis, right (HCC) -X-ray showing osteomyelitis of the distal tuft of the third distal phalanx with diffuse soft tissue swelling of the third digit -IV Rocephin and vancomycin -Pain meds -Consult orthopedics (ER provider spoke with Dr. Rosita Kea will see patient in the a.m.)    DVT prophylaxis: Lovenox  Code Status: full code  Family Communication:  none  Disposition Plan: Back to previous home environment Consults called: Orthopedics Status:At the time of  admission, it appears that the appropriate admission status for this patient is INPATIENT. This is judged to be reasonable and necessary in order to provide the required intensity of service to ensure the patient's safety given the presenting symptoms, physical exam findings, and initial radiographic and laboratory data in the context of their  Comorbid conditions.   Patient requires inpatient status due to high intensity of service, high risk for further deterioration and high frequency of surveillance required.   I certify that at the point of admission it is my clinical judgment that the patient will require inpatient hospital care spanning beyond 2 midnights     Andris Baumann MD [facility]     10/24/2020, 9:45 PM

## 2020-10-24 NOTE — ED Notes (Signed)
ED TO INPATIENT HANDOFF REPORT  ED Nurse Name and Phone #: Anette Riedel [phone]  S Name/Age/Gender Lindsay Frye 54 y.o. female Room/Bed: [phone]  Code Status   Code Status: Full Code  Home/SNF/Other Home Patient oriented to: self, place, time and situation Is this baseline? Yes   Triage Complete: Triage complete  Chief Complaint Infection of finger [L08.9]  Triage Note Reports she was sent here for a wound check. Right hand 3rd digit. Pt had it drained 12/19 and prescribed ABX. States finger is looking better but needs someone to reevaluate it. Swelling present.    Allergies No Known Allergies  Level of Care/Admitting Diagnosis ED Disposition    ED Disposition Condition Comment   Admit  Hospital Area: [facility] [[id number]]  Level of Care: Med-Surg [16]  Covid Evaluation: Asymptomatic Screening Protocol (No Symptoms)  Diagnosis: Infection of finger [726024]  Admitting Physician: Andris Baumann [[id number]]  Attending Physician: Andris Baumann [[id number]]  Estimated length of stay: 3 - 4 days  Certification:: I certify this patient will need inpatient services for at least 2 midnights       B Medical/Surgery History History reviewed. No pertinent past medical history. Past Surgical History:  Procedure Laterality Date   ABDOMINAL SURGERY     skull plate       A IV Location/Drains/Wounds Patient Lines/Drains/Airways Status    Active Line/Drains/Airways    Name Placement date Placement time Site Days   Peripheral IV 10/24/20 Right Antecubital 10/24/20  1811  Antecubital  less than 1          Intake/Output Last 24 hours No intake or output data in the 24 hours ending 10/24/20 2227  Labs/Imaging Results for orders placed or performed during the hospital encounter of 10/24/20 (from the past 48 hour(s))  CBC with Differential     Status: None   Collection Time: 10/24/20  6:16 PM  Result Value Ref Range   WBC 5.2 4.0 - 10.5 K/uL   RBC 4.18  3.87 - 5.11 MIL/uL   Hemoglobin 13.4 12.0 - 15.0 g/dL   HCT 03.5 00.9 - 38.1 %   MCV 96.7 80.0 - 100.0 fL   MCH 32.1 26.0 - 34.0 pg   MCHC 33.2 30.0 - 36.0 g/dL   RDW 82.9 93.7 - 16.9 %   Platelets 220 150 - 400 K/uL   nRBC 0.0 0.0 - 0.2 %   Neutrophils Relative % 46 %   Neutro Abs 2.4 1.7 - 7.7 K/uL   Lymphocytes Relative 41 %   Lymphs Abs 2.1 0.7 - 4.0 K/uL   Monocytes Relative 10 %   Monocytes Absolute 0.5 0.1 - 1.0 K/uL   Eosinophils Relative 2 %   Eosinophils Absolute 0.1 0.0 - 0.5 K/uL   Basophils Relative 1 %   Basophils Absolute 0.0 0.0 - 0.1 K/uL   Immature Granulocytes 0 %   Abs Immature Granulocytes 0.01 0.00 - 0.07 K/uL    Comment: Performed at [facility], [address], [place], [place] [address]  Basic metabolic panel     Status: None   Collection Time: 10/24/20  6:16 PM  Result Value Ref Range   Sodium 138 135 - 145 mmol/L   Potassium 4.3 3.5 - 5.1 mmol/L   Chloride 105 98 - 111 mmol/L   CO2 27 22 - 32 mmol/L   Glucose, Bld 93 70 - 99 mg/dL    Comment: Glucose reference range applies only to samples taken after fasting for  at least 8 hours.   BUN 13 6 - 20 mg/dL   Creatinine, Ser 3.15 0.44 - 1.00 mg/dL   Calcium 8.9 8.9 - 40.0 mg/dL   GFR, Estimated >86 >76 mL/min    Comment: (NOTE) Calculated using the CKD-EPI Creatinine Equation (2021)    Anion gap 6 5 - 15    Comment: Performed at [facility], [address] [place], [place] [address]   DG Finger Middle Right  Result Date: 10/24/2020 CLINICAL DATA:  Right third digit infection, status post incision and drainage 10/21/2020 EXAM: RIGHT MIDDLE FINGER 2+V COMPARISON:  10/21/2020 FINDINGS: Frontal, oblique, lateral views of the right third digit are obtained. Soft tissue defect compatible with prior incision and drainage. There is diffuse edema of the third digit. There is bony resorption along the volar medial aspect of the distal tuft of the third distal phalanx diagnostic of  osteomyelitis. No acute fractures. No radiopaque foreign body. IMPRESSION: 1. Osteomyelitis of the distal tuft of the third distal phalanx. 2. Diffuse soft tissue swelling of the third digit, with evidence of previous incision and drainage as above. Electronically Signed   By: Sharlet Salina M.D.   On: 10/24/2020 17:54    Pending Labs Unresulted Labs (From admission, onward)          Start     Ordered   10/31/20 0500  Creatinine, serum  (enoxaparin (LOVENOX)    CrCl >/= 30 ml/min)  Weekly,   STAT     Comments: while on enoxaparin therapy    10/24/20 2144   10/25/20 0500  Basic metabolic panel  Tomorrow morning,   STAT        10/24/20 2144   10/25/20 0500  CBC  Tomorrow morning,   STAT        10/24/20 2144   10/24/20 2140  HIV Antibody (routine testing w rflx)  (HIV Antibody (Routine testing w reflex) panel)  Once,   STAT        10/24/20 2144   10/24/20 2120  Resp Panel by RT-PCR (Flu A&B, Covid) Nasopharyngeal Swab  (Resp Panel by RT-PCR (Flu A&B, Covid) w precautions)  Once,   STAT       Question Answer Comment  Is this test for diagnosis or screening Screening   Symptomatic for COVID-19 as defined by CDC No   Hospitalized for COVID-19 No   Admitted to ICU for COVID-19 No   Previously tested for COVID-19 No   Resident in a congregate (group) care setting No   Employed in healthcare setting No   Pregnant No   Has patient completed COVID vaccination(s) (2 doses of Pfizer/Moderna 1 dose of [employer]) Unknown      10/24/20 2120          Vitals/Pain Today's Vitals   10/24/20 1604 10/24/20 2059 10/24/20 2226 10/24/20 2226  BP:    118/73  Pulse:    76  Resp:    16  Temp:    99 F (37.2 C)  TempSrc:    Oral  SpO2:    99%  Weight: 99 kg     Height: 5\' 6"  (1.676 m)     PainSc: 4  10-Worst pain ever 8      Isolation Precautions Airborne precautions  Medications Medications  enoxaparin (LOVENOX) injection 50 mg (has no administration in time range)  acetaminophen  (TYLENOL) tablet 650 mg (has no administration in time range)    Or  acetaminophen (TYLENOL) suppository 650 mg (  has no administration in time range)  oxyCODONE (Oxy IR/ROXICODONE) immediate release tablet 5 mg (has no administration in time range)  ondansetron (ZOFRAN) tablet 4 mg (has no administration in time range)    Or  ondansetron (ZOFRAN) injection 4 mg (has no administration in time range)  ibuprofen (ADVIL) tablet 600 mg (has no administration in time range)  clindamycin (CLEOCIN) IVPB 600 mg (has no administration in time range)  lidocaine (PF) (XYLOCAINE) 1 % injection 5 mL (5 mLs Intradermal Given by Other 10/24/20 1805)  clindamycin (CLEOCIN) IVPB 600 mg (0 mg Intravenous Stopped 10/24/20 2114)  oxyCODONE-acetaminophen (PERCOCET/ROXICET) 5-325 MG per tablet 1 tablet (1 tablet Oral Given 10/24/20 2114)    Mobility walks Low fall risk   Focused Assessments    R Recommendations: See Admitting Provider Note  Report given to:   Additional Notes:

## 2020-10-24 NOTE — ED Notes (Signed)
Pt alert.  Pt moved to bed 30.  Pt waiting on bed assignment.  Family with pt.  Iv in place.

## 2020-10-24 NOTE — ED Notes (Signed)
Receiving RN messaged for handoff 

## 2020-10-24 NOTE — Progress Notes (Signed)
PHARMACIST - PHYSICIAN COMMUNICATION  CONCERNING:  Enoxaparin (Lovenox) for DVT Prophylaxis    RECOMMENDATION: Patient was prescribed enoxaparin 40mg  q24 hours for VTE prophylaxis.   Filed Weights   10/24/20 1604  Weight: 99 kg (218 lb 4.1 oz)    Body mass index is 35.23 kg/m.  Estimated Creatinine Clearance: 96.5 mL/min (by C-G formula based on SCr of 0.79 mg/dL).   Based on [facility] policy patient is candidate for enoxaparin 0.5mg /kg TBW SQ every 24 hours based on BMI being >30.  DESCRIPTION: Pharmacy has adjusted enoxaparin dose per [facility] policy.  Patient is now receiving enoxaparin 50 mg every 24 hours   [facility] 10/24/2020 9:49 PM

## 2020-10-25 ENCOUNTER — Inpatient Hospital Stay: Payer: Self-pay | Admitting: Anesthesiology

## 2020-10-25 ENCOUNTER — Encounter
Admission: EM | Disposition: A | Discharge status: Home / Self Care | Payer: Self-pay | Source: Home / Self Care | Attending: Internal Medicine

## 2020-10-25 HISTORY — PX: AMPUTATION: SHX166

## 2020-10-25 LAB — CBC
HCT: 37.2 % (ref 36.0–46.0)
Hemoglobin: 12.7 g/dL (ref 12.0–15.0)
MCH: 32.7 pg (ref 26.0–34.0)
MCHC: 34.1 g/dL (ref 30.0–36.0)
MCV: 95.9 fL (ref 80.0–100.0)
Platelets: 222 10*3/uL (ref 150–400)
RBC: 3.88 MIL/uL (ref 3.87–5.11)
RDW: 12.9 % (ref 11.5–15.5)
WBC: 5.2 10*3/uL (ref 4.0–10.5)
nRBC: 0 % (ref 0.0–0.2)

## 2020-10-25 LAB — BASIC METABOLIC PANEL
Anion gap: 9 (ref 5–15)
BUN: 13 mg/dL (ref 6–20)
CO2: 27 mmol/L (ref 22–32)
Calcium: 8.8 mg/dL — ABNORMAL LOW (ref 8.9–10.3)
Chloride: 104 mmol/L (ref 98–111)
Creatinine, Ser: 0.61 mg/dL (ref 0.44–1.00)
GFR, Estimated: >60 mL/min (ref >60)
Glucose, Bld: 100 mg/dL — ABNORMAL HIGH (ref 70–99)
Potassium: 3.5 mmol/L (ref 3.5–5.1)
Sodium: 140 mmol/L (ref 135–145)

## 2020-10-25 LAB — HIV ANTIBODY (ROUTINE TESTING W REFLEX): HIV Screen 4th Generation wRfx: NONREACTIVE

## 2020-10-25 SURGERY — AMPUTATION DIGIT
Anesthesia: General | Laterality: Right

## 2020-10-25 MED ORDER — METOCLOPRAMIDE HCL 5 MG/ML IJ SOLN: 5.0000 mg | Freq: Three times a day (TID) | INTRAMUSCULAR | Status: DC | PRN

## 2020-10-25 MED ORDER — LIDOCAINE HCL (CARDIAC) PF 100 MG/5ML IV SOSY
PREFILLED_SYRINGE | INTRAVENOUS | Status: DC | PRN
  Administered 2020-10-25: 100 mg via INTRAVENOUS

## 2020-10-25 MED ORDER — FENTANYL CITRATE (PF) 100 MCG/2ML IJ SOLN
INTRAMUSCULAR | Status: DC | PRN
  Administered 2020-10-25: 50 ug via INTRAVENOUS

## 2020-10-25 MED ORDER — BUPIVACAINE HCL (PF) 0.5 % IJ SOLN
INTRAMUSCULAR | Status: DC | PRN
  Administered 2020-10-25: 20 mL

## 2020-10-25 MED ORDER — PHENYLEPHRINE HCL (PRESSORS) 10 MG/ML IV SOLN
INTRAVENOUS | Status: DC | PRN
  Administered 2020-10-25 (×2): 100 ug via INTRAVENOUS

## 2020-10-25 MED ORDER — PROPOFOL 10 MG/ML IV BOLUS
INTRAVENOUS | Status: DC | PRN
  Administered 2020-10-25: 120 mg via INTRAVENOUS

## 2020-10-25 MED ORDER — SODIUM CHLORIDE 0.9 % IV SOLN
1.0000 g | INTRAVENOUS | Status: DC
  Administered 2020-10-25 – 2020-10-26 (×2): 1 g via INTRAVENOUS
  Filled 2020-10-25: qty 1
  Filled 2020-10-25: qty 10

## 2020-10-25 MED ORDER — VANCOMYCIN HCL 2000 MG/400ML IV SOLN
2000.0000 mg | Freq: Once | INTRAVENOUS | Status: AC
  Administered 2020-10-25: 04:00:00 2000 mg via INTRAVENOUS
  Filled 2020-10-25: qty 400

## 2020-10-25 MED ORDER — ONDANSETRON HCL 4 MG/2ML IJ SOLN: 4.0000 mg | Freq: Four times a day (QID) | INTRAMUSCULAR | Status: DC | PRN

## 2020-10-25 MED ORDER — ONDANSETRON HCL 4 MG/2ML IJ SOLN: 4.0000 mg | Freq: Once | INTRAMUSCULAR | Status: DC | PRN

## 2020-10-25 MED ORDER — MORPHINE SULFATE (PF) 2 MG/ML IV SOLN
2.0000 mg | INTRAVENOUS | Status: DC | PRN
  Administered 2020-10-25 (×2): 2 mg via INTRAVENOUS
  Filled 2020-10-25 (×2): qty 1

## 2020-10-25 MED ORDER — ONDANSETRON HCL 4 MG/2ML IJ SOLN
INTRAMUSCULAR | Status: DC | PRN
  Administered 2020-10-25: 4 mg via INTRAVENOUS

## 2020-10-25 MED ORDER — ONDANSETRON HCL 4 MG PO TABS: 4.0000 mg | ORAL_TABLET | Freq: Four times a day (QID) | ORAL | Status: DC | PRN

## 2020-10-25 MED ORDER — DEXAMETHASONE SODIUM PHOSPHATE 10 MG/ML IJ SOLN
INTRAMUSCULAR | Status: DC | PRN
  Administered 2020-10-25: 5 mg via INTRAVENOUS

## 2020-10-25 MED ORDER — ONDANSETRON HCL 4 MG/2ML IJ SOLN
INTRAMUSCULAR | Status: AC
  Filled 2020-10-25: qty 2

## 2020-10-25 MED ORDER — GLYCOPYRROLATE 0.2 MG/ML IJ SOLN
INTRAMUSCULAR | Status: DC | PRN
  Administered 2020-10-25: .2 mg via INTRAVENOUS

## 2020-10-25 MED ORDER — DEXAMETHASONE SODIUM PHOSPHATE 10 MG/ML IJ SOLN
INTRAMUSCULAR | Status: AC
  Filled 2020-10-25: qty 1

## 2020-10-25 MED ORDER — PROPOFOL 10 MG/ML IV BOLUS
INTRAVENOUS | Status: AC
  Filled 2020-10-25: qty 20

## 2020-10-25 MED ORDER — SODIUM CHLORIDE 0.9 % IV SOLN: INTRAVENOUS | Status: DC | PRN

## 2020-10-25 MED ORDER — VANCOMYCIN HCL IN DEXTROSE 1-5 GM/200ML-% IV SOLN
1000.0000 mg | Freq: Three times a day (TID) | INTRAVENOUS | Status: DC
  Filled 2020-10-25 (×2): qty 200

## 2020-10-25 MED ORDER — VANCOMYCIN HCL 750 MG/150ML IV SOLN
750.0000 mg | Freq: Two times a day (BID) | INTRAVENOUS | Status: DC
  Administered 2020-10-25 – 2020-10-26 (×2): 750 mg via INTRAVENOUS
  Filled 2020-10-25 (×3): qty 150

## 2020-10-25 MED ORDER — KETOROLAC TROMETHAMINE 30 MG/ML IJ SOLN
30.0000 mg | Freq: Three times a day (TID) | INTRAMUSCULAR | Status: DC | PRN
  Administered 2020-10-25 – 2020-10-26 (×3): 30 mg via INTRAVENOUS
  Filled 2020-10-25 (×3): qty 1

## 2020-10-25 MED ORDER — MIDAZOLAM HCL 2 MG/2ML IJ SOLN
INTRAMUSCULAR | Status: AC
  Filled 2020-10-25: qty 2

## 2020-10-25 MED ORDER — CALCIUM CARBONATE ANTACID 500 MG PO CHEW
2.0000 | CHEWABLE_TABLET | Freq: Once | ORAL | Status: AC
  Administered 2020-10-25: 10:00:00 400 mg via ORAL
  Filled 2020-10-25: qty 2

## 2020-10-25 MED ORDER — FENTANYL CITRATE (PF) 100 MCG/2ML IJ SOLN: 25.0000 ug | INTRAMUSCULAR | Status: DC | PRN

## 2020-10-25 MED ORDER — METOCLOPRAMIDE HCL 5 MG PO TABS: 5.0000 mg | ORAL_TABLET | Freq: Three times a day (TID) | ORAL | Status: DC | PRN

## 2020-10-25 MED ORDER — INFLUENZA VAC SPLIT QUAD 0.5 ML IM SUSY
0.5000 mL | PREFILLED_SYRINGE | INTRAMUSCULAR | Status: AC
  Administered 2020-10-26: 08:00:00 0.5 mL via INTRAMUSCULAR
  Filled 2020-10-25: qty 0.5

## 2020-10-25 MED ORDER — MIDAZOLAM HCL 2 MG/2ML IJ SOLN
INTRAMUSCULAR | Status: DC | PRN
  Administered 2020-10-25: 2 mg via INTRAVENOUS

## 2020-10-25 MED ORDER — DOCUSATE SODIUM 100 MG PO CAPS
100.0000 mg | ORAL_CAPSULE | Freq: Two times a day (BID) | ORAL | Status: DC
  Administered 2020-10-25 – 2020-10-26 (×2): 100 mg via ORAL
  Filled 2020-10-25 (×2): qty 1

## 2020-10-25 MED ORDER — FENTANYL CITRATE (PF) 100 MCG/2ML IJ SOLN
INTRAMUSCULAR | Status: AC
  Filled 2020-10-25: qty 2

## 2020-10-25 SURGICAL SUPPLY — 24 items
BNDG GAUZE 1X2.1 STRL (MISCELLANEOUS) ×2 IMPLANT
CANISTER SUCT 1200ML W/VALVE (MISCELLANEOUS) ×2 IMPLANT
CHLORAPREP W/TINT 26 (MISCELLANEOUS) IMPLANT
COVER WAND RF STERILE (DRAPES) ×2 IMPLANT
CUFF TOURN SGL QUICK 18X4 (TOURNIQUET CUFF) IMPLANT
DRAIN PENROSE 1/4X12 LTX STRL (WOUND CARE) ×2 IMPLANT
DURAPREP 26ML APPLICATOR (WOUND CARE) ×2 IMPLANT
ELECT CAUTERY BLADE 6.4 (BLADE) ×2 IMPLANT
GAUZE SPONGE 4X4 12PLY STRL (GAUZE/BANDAGES/DRESSINGS) ×2 IMPLANT
GAUZE XEROFORM 1X8 LF (GAUZE/BANDAGES/DRESSINGS) ×2 IMPLANT
GLOVE BIOGEL PI IND STRL 9 (GLOVE) ×1 IMPLANT
GLOVE BIOGEL PI INDICATOR 9 (GLOVE) ×1
GLOVE SURG SYN 9.0  PF PI (GLOVE) ×1
GLOVE SURG SYN 9.0 PF PI (GLOVE) ×1 IMPLANT
GOWN SRG 2XL LVL 4 RGLN SLV (GOWNS) ×1 IMPLANT
GOWN STRL NON-REIN 2XL LVL4 (GOWNS) ×1
GOWN STRL REUS W/ TWL LRG LVL3 (GOWN DISPOSABLE) ×1 IMPLANT
GOWN STRL REUS W/TWL LRG LVL3 (GOWN DISPOSABLE) ×1
KIT TURNOVER KIT A (KITS) ×2 IMPLANT
MANIFOLD NEPTUNE II (INSTRUMENTS) ×2 IMPLANT
NEEDLE HYPO 25X1 1.5 SAFETY (NEEDLE) ×2 IMPLANT
PACK EXTREMITY ARMC (MISCELLANEOUS) ×2 IMPLANT
SPONGE GAUZE 2X2 8PLY STRL LF (GAUZE/BANDAGES/DRESSINGS) ×2 IMPLANT
STOCKINETTE STRL 4IN 9604848 (GAUZE/BANDAGES/DRESSINGS) ×2 IMPLANT

## 2020-10-25 NOTE — Progress Notes (Signed)
PROGRESS Frye    [name]  [name]:[id number] DOB: 28-Feb-1966 DOA: 10/24/2020 PCP: Patient, No Pcp Per   Brief Narrative: Taken from H&P.  Lindsay Frye is a 54 y.o. female with no significant past medical history presenting to the emergency room with pain in my finger.  Patient was seen in the ER on 12/19 and had I&D of paronychia and started on Keflex however she states that the area has become increasingly painful.  Pain is throbbing and of severe intensity, unrelieved with OTC and prescription pain medication.  She denies fever or chills. Imaging concerning for osteomyelitis of the distal tuft of third distal phalanx, orthopedic was consulted and she is going to the OR for distal phalanx amputation today.  Subjective: Patient continued to experience pain in her finger.  Husband at bedside.  Waiting for surgery later today.  Assessment & Plan:   Active Problems:   Infection of finger   Finger osteomyelitis, right (HCC)  Osteomyelitis of distal third right phalanx.  Going for distal phalanx amputation later today with orthopedic surgery.  Patient is right-hand predominant. -Continue Rocephin and vancomycin for now. -Continue with pain management. -Follow-up postoperative recommendations.  Objective: Vitals:   10/25/20 0432 10/25/20 0803 10/25/20 1155 10/25/20 1420  BP: 112/63 107/63 125/80 (!) 141/80  Pulse: 65 60 64 87  Resp: 16 16 20    Temp: 97.8 F (36.6 C) 97.8 F (36.6 C) (!) 97.5 F (36.4 C) 97.8 F (36.6 C)  TempSrc: Oral Oral Oral Tympanic  SpO2: 100% 100% 100% 100%  Weight:      Height:        Intake/Output Summary (Last 24 hours) at 10/25/2020 1505 Last data filed at 10/25/2020 0500 Gross per 24 hour  Intake 215.79 ml  Output 200 ml  Net 15.79 ml   Filed Weights   10/24/20 1604  Weight: 99 kg    Examination:  General exam: Appears calm and comfortable  Respiratory system: Clear to auscultation. Respiratory effort normal. Cardiovascular system:  S1 & S2 heard, RRR.  Gastrointestinal system: Soft, nontender, nondistended, bowel sounds positive. Central nervous system: Alert and oriented. No focal neurological deficits.Symmetric 5 x 5 power. Extremities: No edema, no cyanosis, pulses intact and symmetrical. Skin: Right middle finger with clean bandage. Psychiatry: Judgement and insight appear normal. Mood & affect appropriate.    DVT prophylaxis: Lovenox Code Status: Full Family Communication: Husband was updated at bedside Disposition Plan:  Status is: Inpatient  Remains inpatient appropriate because:Inpatient level of care appropriate due to severity of illness   Dispo: The patient is from: Home              Anticipated d/c is to: Home              Anticipated d/c date is: 1 day              Patient currently is not medically stable to d/c.   Consultants:   Orthopedic  Procedures:  Antimicrobials:  Rocephin Vancomycin  Data Reviewed: I have personally reviewed following labs and imaging studies  CBC: Recent Labs  Lab 10/24/20 1816 10/25/20 0353  WBC 5.2 5.2  NEUTROABS 2.4  --   HGB 13.4 12.7  HCT 40.4 37.2  MCV 96.7 95.9  PLT 220 222   Basic Metabolic Panel: Recent Labs  Lab 10/24/20 1816 10/25/20 0353  NA 138 140  K 4.3 3.5  CL 105 104  CO2 27 27  GLUCOSE 93 100*  BUN 13 13  CREATININE 0.79 0.61  CALCIUM 8.9 8.8*   GFR: Estimated Creatinine Clearance: 96.5 mL/min (by C-G formula based on SCr of 0.61 mg/dL). Liver Function Tests: No results for input(s): AST, ALT, ALKPHOS, BILITOT, PROT, ALBUMIN in the last 168 hours. No results for input(s): LIPASE, AMYLASE in the last 168 hours. No results for input(s): AMMONIA in the last 168 hours. Coagulation Profile: No results for input(s): INR, PROTIME in the last 168 hours. Cardiac Enzymes: No results for input(s): CKTOTAL, CKMB, CKMBINDEX, TROPONINI in the last 168 hours. BNP (last 3 results) No results for input(s): PROBNP in the last 8760  hours. HbA1C: No results for input(s): HGBA1C in the last 72 hours. CBG: No results for input(s): GLUCAP in the last 168 hours. Lipid Profile: No results for input(s): CHOL, HDL, LDLCALC, TRIG, CHOLHDL, LDLDIRECT in the last 72 hours. Thyroid Function Tests: No results for input(s): TSH, T4TOTAL, FREET4, T3FREE, THYROIDAB in the last 72 hours. Anemia Panel: No results for input(s): VITAMINB12, FOLATE, FERRITIN, TIBC, IRON, RETICCTPCT in the last 72 hours. Sepsis Labs: No results for input(s): PROCALCITON, LATICACIDVEN in the last 168 hours.  Recent Results (from the past 240 hour(s))  Resp Panel by RT-PCR (Flu A&B, Covid) Nasopharyngeal Swab     Status: None   Collection Time: 10/24/20 10:24 PM   Specimen: Nasopharyngeal Swab; Nasopharyngeal(NP) swabs in vial transport medium  Result Value Ref Range Status   SARS Coronavirus 2 by RT PCR NEGATIVE NEGATIVE Final    Comment: (Frye) SARS-CoV-2 target nucleic acids are NOT DETECTED.  The SARS-CoV-2 RNA is generally detectable in upper respiratory specimens during the acute phase of infection. The lowest concentration of SARS-CoV-2 viral copies this assay can detect is 138 copies/mL. A negative result does not preclude SARS-Cov-2 infection and should not be used as the sole basis for treatment or other patient management decisions. A negative result may occur with  improper specimen collection/handling, submission of specimen other than nasopharyngeal swab, presence of viral mutation(s) within the areas targeted by this assay, and inadequate number of viral copies(<138 copies/mL). A negative result must be combined with clinical observations, patient history, and epidemiological information. The expected result is Negative.  Fact Sheet for Patients:  BloggerCourse.com  Fact Sheet for Healthcare Providers:  SeriousBroker.it  This test is no t yet approved or cleared by the [place] FDA and  has been authorized for detection and/or diagnosis of SARS-CoV-2 by FDA under an Emergency Use Authorization (EUA). This EUA will remain  in effect (meaning this test can be used) for the duration of the COVID-19 declaration under Section 564(b)(1) of the Act, 21 U.S.C.section 360bbb-3(b)(1), unless the authorization is terminated  or revoked sooner.       Influenza A by PCR NEGATIVE NEGATIVE Final   Influenza B by PCR NEGATIVE NEGATIVE Final    Comment: (Frye) The Xpert Xpress SARS-CoV-2/FLU/RSV plus assay is intended as an aid in the diagnosis of influenza from Nasopharyngeal swab specimens and should not be used as a sole basis for treatment. Nasal washings and aspirates are unacceptable for Xpert Xpress SARS-CoV-2/FLU/RSV testing.  Fact Sheet for Patients: BloggerCourse.com  Fact Sheet for Healthcare Providers: SeriousBroker.it  This test is not yet approved or cleared by the [place] FDA and has been authorized for detection and/or diagnosis of SARS-CoV-2 by FDA under an Emergency Use Authorization (EUA). This EUA will remain in effect (meaning this test can be used) for the duration of the COVID-19 declaration under Section 564(b)(1) of the Act, 21  U.S.C. section 360bbb-3(b)(1), unless the authorization is terminated or revoked.  Performed at [facility], [address], [place], [place] [address]      Radiology Studies: DG Finger Middle Right  Result Date: 10/24/2020 CLINICAL DATA:  Right third digit infection, status post incision and drainage 10/21/2020 EXAM: RIGHT MIDDLE FINGER 2+V COMPARISON:  10/21/2020 FINDINGS: Frontal, oblique, lateral views of the right third digit are obtained. Soft tissue defect compatible with prior incision and drainage. There is diffuse edema of the third digit. There is bony resorption along the volar medial aspect of the distal tuft of the third distal  phalanx diagnostic of osteomyelitis. No acute fractures. No radiopaque foreign body. IMPRESSION: 1. Osteomyelitis of the distal tuft of the third distal phalanx. 2. Diffuse soft tissue swelling of the third digit, with evidence of previous incision and drainage as above. Electronically Signed   By: Sharlet Salina M.D.   On: 10/24/2020 17:54    Scheduled Meds: . [START ON 10/26/2020] influenza vac split quadrivalent PF  0.5 mL Intramuscular Tomorrow-1000   Continuous Infusions: . [MAR Hold] cefTRIAXone (ROCEPHIN)  IV 1 g (10/25/20 0759)  . [MAR Hold] vancomycin       LOS: 1 day   Time spent: 35 minutes.  Arnetha Courser, MD [facility]  If 7PM-7AM, please contact night-coverage [email or web address]  10/25/2020, 3:05 PM   This record has been created using Conservation officer, historic buildings. Errors have been sought and corrected,but may not always be located. Such creation errors do not reflect on the standard of care.

## 2020-10-25 NOTE — Anesthesia Preprocedure Evaluation (Addendum)
Anesthesia Evaluation  Patient identified by MRN, date of birth, ID band Patient awake    Reviewed: Allergy & Precautions, NPO status , Patient's Chart, lab work & pertinent test results  Airway Mallampati: II  TM Distance: >3 FB     Dental  (+) Poor Dentition, Caps   Pulmonary Current Smoker,    Pulmonary exam normal        Cardiovascular negative cardio ROS Normal cardiovascular exam     Neuro/Psych negative neurological ROS  negative psych ROS   GI/Hepatic Neg liver ROS,   Endo/Other  negative endocrine ROS  Renal/GU negative Renal ROS     Musculoskeletal   Abdominal Normal abdominal exam  (+)   Peds negative pediatric ROS (+)  Hematology negative hematology ROS (+)   Anesthesia Other Findings History reviewed. No pertinent past medical history.  Reproductive/Obstetrics                            Anesthesia Physical Anesthesia Plan  ASA: II  Anesthesia Plan: General   Post-op Pain Management:    Induction: Intravenous  PONV Risk Score and Plan:   Airway Management Planned: LMA  Additional Equipment:   Intra-op Plan:   Post-operative Plan: Extubation in OR  Informed Consent: I have reviewed the patients History and Physical, chart, labs and discussed the procedure including the risks, benefits and alternatives for the proposed anesthesia with the patient or authorized representative who has indicated his/her understanding and acceptance.     Dental advisory given  Plan Discussed with: CRNA and Surgeon  Anesthesia Plan Comments:         Anesthesia Quick Evaluation

## 2020-10-25 NOTE — Op Note (Signed)
10/25/2020  3:42 PM  PATIENT:  Lindsay Frye  54 y.o. female  PRE-OPERATIVE DIAGNOSIS:  Right Middle Finger Tip Amputation  POST-OPERATIVE DIAGNOSIS:  Right Middle Finger Tip Amputation  PROCEDURE:  Procedure(s): AMPUTATION DIGIT-Right Middle Finger (Right)  SURGEON: Leitha Schuller, MD  ASSISTANTS: None  ANESTHESIA:   general  EBL:  No intake/output data recorded.  BLOOD ADMINISTERED:none  DRAINS: none   LOCAL MEDICATIONS USED:  MARCAINE     SPECIMEN:  Source of Specimen:  Fingertip right middle finger  DISPOSITION OF SPECIMEN:  PATHOLOGY  COUNTS:  YES  TOURNIQUET:   Penrose drain uses tourniquet for approximately 20 minutes  IMPLANTS: None  DICTATION: .Dragon Dictation patient brought the operating room and after adequate anesthesia was obtained the right arm was prepped and draped in usual sterile fashion.  After patient identification timeout procedures were completed a digital block was placed to aid in postop analgesia with 10 cc of half percent Marcaine injected into the base of the third MCP joint medial and lateral volar and dorsal on each side for a total of 20 cc.  A Penrose drain was then wrapped around the base of the finger to act as a tourniquet held in place with a hemostat a fishmouth incision was then utilized with the prior nail bed being ablated to try to prevent nail regrowth the finger came off after this with most of the proximal phalanx left behind this was debrided and part of it sent for specimen as well the joint was exposed and thoroughly irrigated there is no evidence of infection at this level.  The fishmouth flaps were fashioned to get primary wound coverage this is done using simple erupted 4-0 nylon but first a Penrose drain was released and hemostasis checked electrocautery.  The digital nerves were identified into this distal there was fairly small released so they were deep to the incision.  The wound was closed with simple erupted 4-0 nylon  skin sutures followed by Xeroform 2 x 2's a 1 inch Kling followed by bias wrap.  PLAN OF CARE: Admit to inpatient   PATIENT DISPOSITION:  PACU - hemodynamically stable.

## 2020-10-25 NOTE — Progress Notes (Signed)
Pharmacy Antibiotic Note  Lindsay Frye is a 54 y.o. 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  Pharmacy has been consulted for vancomycin dosing. Her renal function is stable at what appears to be her baseline level.  Plan: adjust vancomycin dose to 750 mg IV every 12 hours  Ke: 0.068 h-1  T1/2: 10.3 h  Css (calculated): 27.3 / 13.0 mcg/mL  Daily SCr to assess renal function while on vancomycin  Levels as clinically indicated (goal trough 10 - 15 mcg/mL)  Height: 5\' 6"  (167.6 cm) Weight: 99 kg (218 lb 4.1 oz) IBW/kg (Calculated) : 59.3  Temp (24hrs), Avg:98.3 F (36.8 C), Min:97.5 F (36.4 C), Max:99.2 F (37.3 C)  Recent Labs  Lab 10/24/20 1816 10/25/20 0353  WBC 5.2 5.2  CREATININE 0.79 0.61    Estimated Creatinine Clearance: 96.5 mL/min (by C-G formula based on SCr of 0.61 mg/dL).    No Known Allergies  Antimicrobials this admission: ceftriaxone 12/23  >>  vancomycin  12/23 >>   Microbiology results: 12/22 SARS CoV-2: negative 12/22 influenza A/B: negative     Thank you for allowing pharmacy to be a part of this patient's care.  1/23 10/25/2020 10:19 AM

## 2020-10-25 NOTE — Progress Notes (Signed)
Pharmacy Antibiotic Note  SHI GROSE is a 54 y.o. female admitted on 10/24/2020 with osteomyelitis.  Pharmacy has been consulted for Vancomycin dosing.  Plan: Vancomycin 2gm LD x 1 then 1gm IV q8hrs (per nomogram)  Height: 5\' 6"  (167.6 cm) Weight: 99 kg (218 lb 4.1 oz) IBW/kg (Calculated) : 59.3  Temp (24hrs), Avg:98.6 F (37 C), Min:97.5 F (36.4 C), Max:99.2 F (37.3 C)  Recent Labs  Lab 10/24/20 1816  WBC 5.2  CREATININE 0.79    Estimated Creatinine Clearance: 96.5 mL/min (by C-G formula based on SCr of 0.79 mg/dL).    No Known Allergies  Antimicrobials this admission:   >>    >>   Dose adjustments this admission:   Microbiology results:  BCx:   UCx:    Sputum:    MRSA PCR:   Thank you for allowing pharmacy to be a part of this patient's care.  10/26/20 A 10/25/2020 3:35 AM

## 2020-10-25 NOTE — Anesthesia Postprocedure Evaluation (Signed)
Anesthesia Post Note  Patient: Lindsay Frye  Procedure(s) Performed: AMPUTATION DIGIT-Right Middle Finger (Right )  Patient location during evaluation: PACU Anesthesia Type: General Level of consciousness: awake and alert Pain management: pain level controlled Vital Signs Assessment: post-procedure vital signs reviewed and stable Respiratory status: spontaneous breathing, nonlabored ventilation, respiratory function stable and patient connected to nasal cannula oxygen Cardiovascular status: blood pressure returned to baseline and stable Postop Assessment: no apparent nausea or vomiting Anesthetic complications: no   No complications documented.   Last Vitals:  Vitals:   10/25/20 1838 10/25/20 1953  BP: 109/77 120/72  Pulse: 66 66  Resp: 20 20  Temp: 36.8 C 36.9 C  SpO2: 100% 97%    Last Pain:  Vitals:   10/25/20 1922  TempSrc:   PainSc: 7                  Lenard Simmer

## 2020-10-25 NOTE — Transfer of Care (Signed)
Immediate Anesthesia Transfer of Care Note  Patient: Lindsay Frye  Procedure(s) Performed: AMPUTATION DIGIT-Right Middle Finger (Right )  Patient Location: PACU  Anesthesia Type:General  Level of Consciousness: awake, alert  and oriented  Airway & Oxygen Therapy: Patient connected to face mask oxygen  Post-op Assessment: Post -op Vital signs reviewed and stable  Post vital signs: stable  Last Vitals:  Vitals Value Taken Time  BP 150/111 10/25/20 1542  Temp    Pulse 93 10/25/20 1543  Resp 17 10/25/20 1543  SpO2 100 % 10/25/20 1543  Vitals shown include unvalidated device data.  Last Pain:  Vitals:   10/25/20 1420  TempSrc: Tympanic  PainSc:       Patients Stated Pain Goal: 0 (10/25/20 0100)  Complications: No complications documented.

## 2020-10-25 NOTE — Anesthesia Procedure Notes (Signed)
Procedure Name: LMA Insertion Date/Time: 10/25/2020 2:57 PM Performed by: Irving Burton, CRNA Pre-anesthesia Checklist: Patient identified, Emergency Drugs available, Suction available and Patient being monitored Patient Re-evaluated:Patient Re-evaluated prior to induction Oxygen Delivery Method: Circle system utilized Preoxygenation: Pre-oxygenation with 100% oxygen Induction Type: IV induction Ventilation: Mask ventilation without difficulty LMA: LMA inserted LMA Size: 3.5 Number of attempts: 1 Placement Confirmation: positive ETCO2 and breath sounds checked- equal and bilateral Tube secured with: Tape Dental Injury: Teeth and Oropharynx as per pre-operative assessment

## 2020-10-25 NOTE — Consult Note (Signed)
Reason for Consult: Right middle finger infection Referring Physician: Dr. Alfonzo Lindsay [name] Lindsay Frye an 54 y.o. Lindsay Frye.  HPI: Patient Lindsay Lindsay Frye a 54 year old who reports she got her finger caught in a door subsequently had an infection that had opening in the ER this past weekend she came back today with clearly worsening.  X-rays now show osteomyelitis of the tuft of the distal phalanx.  She Lindsay Lindsay Frye complaining of a great deal of pain in the fingertip.  She Lindsay Lindsay Frye right-hand dominant.  History reviewed. No pertinent past medical history.  Past Surgical History:  Procedure Laterality Date  . ABDOMINAL SURGERY    . skull plate      History reviewed. No pertinent family history.  Social History:  reports that she has been smoking. She has never used smokeless tobacco. She reports current alcohol use. She reports that she does not use drugs.  Allergies: No Known Allergies  Medications: I have reviewed the patient's current medications.  Results for orders placed or performed during the hospital encounter of 10/24/20 (from the past 48 hour(s))  CBC with Differential     Status: None   Collection Time: 10/24/20  6:16 PM  Result Value Ref Range   WBC 5.2 4.0 - 10.5 K/uL   RBC 4.18 3.87 - 5.11 MIL/uL   Hemoglobin 13.4 12.0 - 15.0 g/dL   HCT 25.4 27.0 - 62.3 %   MCV 96.7 80.0 - 100.0 fL   MCH 32.1 26.0 - 34.0 pg   MCHC 33.2 30.0 - 36.0 g/dL   RDW 76.2 83.1 - 51.7 %   Platelets 220 150 - 400 K/uL   nRBC 0.0 0.0 - 0.2 %   Neutrophils Relative % 46 %   Neutro Abs 2.4 1.7 - 7.7 K/uL   Lymphocytes Relative 41 %   Lymphs Abs 2.1 0.7 - 4.0 K/uL   Monocytes Relative 10 %   Monocytes Absolute 0.5 0.1 - 1.0 K/uL   Eosinophils Relative 2 %   Eosinophils Absolute 0.1 0.0 - 0.5 K/uL   Basophils Relative 1 %   Basophils Absolute 0.0 0.0 - 0.1 K/uL   Immature Granulocytes 0 %   Abs Immature Granulocytes 0.01 0.00 - 0.07 K/uL    Comment: Performed at [facility], [address] [place],  [place] [address]  Basic metabolic panel     Status: None   Collection Time: 10/24/20  6:16 PM  Result Value Ref Range   Sodium 138 135 - 145 mmol/L   Potassium 4.3 3.5 - 5.1 mmol/L   Chloride 105 98 - 111 mmol/L   CO2 27 22 - 32 mmol/L   Glucose, Bld 93 70 - 99 mg/dL    Comment: Glucose reference range applies only to samples taken after fasting for at least 8 hours.   BUN 13 6 - 20 mg/dL   Creatinine, Ser 3.71 0.44 - 1.00 mg/dL   Calcium 8.9 8.9 - 06.2 mg/dL   GFR, Estimated >69 >48 mL/min    Comment: (NOTE) Calculated using the CKD-EPI Creatinine Equation (2021)    Anion gap 6 5 - 15    Comment: Performed at [facility], [address] [place], [place] [address]  Resp Panel by RT-PCR (Flu A&B, Covid) Nasopharyngeal Swab     Status: None   Collection Time: 10/24/20 10:24 PM   Specimen: Nasopharyngeal Swab; Nasopharyngeal(NP) swabs in vial transport medium  Result Value Ref Range   SARS Coronavirus 2 by RT PCR NEGATIVE NEGATIVE  Comment: (NOTE) SARS-CoV-2 target nucleic acids are NOT DETECTED.  The SARS-CoV-2 RNA Lindsay Lindsay Frye generally detectable in upper respiratory specimens during the acute phase of infection. The lowest concentration of SARS-CoV-2 viral copies this assay can detect Lindsay Lindsay Frye 138 copies/mL. A negative result does not preclude SARS-Cov-2 infection and should not be used as the sole basis for treatment or other patient management decisions. A negative result may occur with  improper specimen collection/handling, submission of specimen other than nasopharyngeal swab, presence of viral mutation(s) within the areas targeted by this assay, and inadequate number of viral copies(<138 copies/mL). A negative result must be combined with clinical observations, patient history, and epidemiological information. The expected result Lindsay Lindsay Frye Negative.  Fact Sheet for Patients:  BloggerCourse.com  Fact Sheet for Healthcare Providers:   SeriousBroker.it  This test Lindsay Lindsay Frye no t yet approved or cleared by the [place] FDA and  has been authorized for detection and/or diagnosis of SARS-CoV-2 by FDA under an Emergency Use Authorization (EUA). This EUA will remain  in effect (meaning this test can be used) for the duration of the COVID-19 declaration under Section 564(b)(1) of the Act, 21 U.S.C.section 360bbb-3(b)(1), unless the authorization Lindsay Lindsay Frye terminated  or revoked sooner.       Influenza A by PCR NEGATIVE NEGATIVE   Influenza B by PCR NEGATIVE NEGATIVE    Comment: (NOTE) The Xpert Xpress SARS-CoV-2/FLU/RSV plus assay Lindsay Lindsay Frye intended as an aid in the diagnosis of influenza from Nasopharyngeal swab specimens and should not be used as a sole basis for treatment. Nasal washings and aspirates are unacceptable for Xpert Xpress SARS-CoV-2/FLU/RSV testing.  Fact Sheet for Patients: BloggerCourse.com  Fact Sheet for Healthcare Providers: SeriousBroker.it  This test Lindsay Lindsay Frye not yet approved or cleared by the [place] FDA and has been authorized for detection and/or diagnosis of SARS-CoV-2 by FDA under an Emergency Use Authorization (EUA). This EUA will remain in effect (meaning this test can be used) for the duration of the COVID-19 declaration under Section 564(b)(1) of the Act, 21 U.S.C. section 360bbb-3(b)(1), unless the authorization Lindsay Lindsay Frye terminated or revoked.  Performed at [facility], [address] [place], [place] [address]   Basic metabolic panel     Status: Abnormal   Collection Time: 10/25/20  3:53 AM  Result Value Ref Range   Sodium 140 135 - 145 mmol/L   Potassium 3.5 3.5 - 5.1 mmol/L   Chloride 104 98 - 111 mmol/L   CO2 27 22 - 32 mmol/L   Glucose, Bld 100 (H) 70 - 99 mg/dL    Comment: Glucose reference range applies only to samples taken after fasting for at least 8 hours.   BUN 13 6 - 20 mg/dL   Creatinine, Ser  0.35 0.44 - 1.00 mg/dL   Calcium 8.8 (L) 8.9 - 10.3 mg/dL   GFR, Estimated >00 >93 mL/min    Comment: (NOTE) Calculated using the CKD-EPI Creatinine Equation (2021)    Anion gap 9 5 - 15    Comment: Performed at [facility], [address] [place], [place] [address]  CBC     Status: None   Collection Time: 10/25/20  3:53 AM  Result Value Ref Range   WBC 5.2 4.0 - 10.5 K/uL   RBC 3.88 3.87 - 5.11 MIL/uL   Hemoglobin 12.7 12.0 - 15.0 g/dL   HCT 93.7 16.9 - 67.8 %   MCV 95.9 80.0 - 100.0 fL   MCH 32.7 26.0 - 34.0 pg   MCHC 34.1 30.0 - 36.0 g/dL  RDW 12.9 11.5 - 15.5 %   Platelets 222 150 - 400 K/uL   nRBC 0.0 0.0 - 0.2 %    Comment: Performed at [facility], [address] [place], [place] [address]    DG Finger Middle Right  Result Date: 10/24/2020 CLINICAL DATA:  Right third digit infection, status post incision and drainage 10/21/2020 EXAM: RIGHT MIDDLE FINGER 2+V COMPARISON:  10/21/2020 FINDINGS: Frontal, oblique, lateral views of the right third digit are obtained. Soft tissue defect compatible with prior incision and drainage. There Lindsay Lindsay Frye diffuse edema of the third digit. There Lindsay Lindsay Frye bony resorption along the volar medial aspect of the distal tuft of the third distal phalanx diagnostic of osteomyelitis. No acute fractures. No radiopaque foreign body. IMPRESSION: 1. Osteomyelitis of the distal tuft of the third distal phalanx. 2. Diffuse soft tissue swelling of the third digit, with evidence of previous incision and drainage as above. Electronically Signed   By: Sharlet Salina M.D.   On: 10/24/2020 17:54    Review of Systems Blood pressure 112/63, pulse 65, temperature 97.8 F (36.6 C), temperature source Oral, resp. rate 16, height 5\' 6"  (1.676 m), weight 99 kg, SpO2 100 %. Physical Exam There Lindsay Lindsay Frye approximately 5 mm open area on the volar aspect of the pulp where previous incision and drainage carried out the fingertip Lindsay Lindsay Frye erythematous with bloody drainage very  tender.  Nail Lindsay Lindsay Frye intact but with some peeling of skin at the base.  No lymphangitis or [place] signs Assessment/Plan: Osteomyelitis right middle fingertip recommendation Lindsay Lindsay Frye for fingertip amputation through the DIP joint to get resolution of infection.  Discussed with her that with osteomyelitis infection really cannot be eliminated and the bone needs to be resected that means fingertip amputation.  10/25/2020, 7:28 AM

## 2020-10-26 LAB — BASIC METABOLIC PANEL
Anion gap: 7 (ref 5–15)
BUN: 13 mg/dL (ref 6–20)
CO2: 27 mmol/L (ref 22–32)
Calcium: 8.6 mg/dL — ABNORMAL LOW (ref 8.9–10.3)
Chloride: 105 mmol/L (ref 98–111)
Creatinine, Ser: 0.63 mg/dL (ref 0.44–1.00)
GFR, Estimated: >60 mL/min (ref >60)
Glucose, Bld: 116 mg/dL — ABNORMAL HIGH (ref 70–99)
Potassium: 3.9 mmol/L (ref 3.5–5.1)
Sodium: 139 mmol/L (ref 135–145)

## 2020-10-26 LAB — CBC
HCT: 36.3 % (ref 36.0–46.0)
Hemoglobin: 12.5 g/dL (ref 12.0–15.0)
MCH: 33 pg (ref 26.0–34.0)
MCHC: 34.4 g/dL (ref 30.0–36.0)
MCV: 95.8 fL (ref 80.0–100.0)
Platelets: 222 10*3/uL (ref 150–400)
RBC: 3.79 MIL/uL — ABNORMAL LOW (ref 3.87–5.11)
RDW: 12.6 % (ref 11.5–15.5)
WBC: 6.3 10*3/uL (ref 4.0–10.5)
nRBC: 0 % (ref 0.0–0.2)

## 2020-10-26 MED ORDER — SULFAMETHOXAZOLE-TRIMETHOPRIM 800-160 MG PO TABS
1.0000 | ORAL_TABLET | Freq: Two times a day (BID) | ORAL | Status: DC
  Administered 2020-10-26: 10:00:00 1 via ORAL
  Filled 2020-10-26 (×2): qty 1

## 2020-10-26 MED ORDER — CEPHALEXIN 500 MG PO CAPS
500.0000 mg | ORAL_CAPSULE | Freq: Four times a day (QID) | ORAL | Status: DC
  Administered 2020-10-26: 10:00:00 500 mg via ORAL
  Filled 2020-10-26: qty 1

## 2020-10-26 MED ORDER — SULFAMETHOXAZOLE-TRIMETHOPRIM 800-160 MG PO TABS: 1.0000 | ORAL_TABLET | Freq: Two times a day (BID) | ORAL | 0 refills | Status: AC

## 2020-10-26 MED ORDER — OXYCODONE-ACETAMINOPHEN 5-325 MG PO TABS: 1.0000 | ORAL_TABLET | Freq: Four times a day (QID) | ORAL | 0 refills | Status: DC | PRN

## 2020-10-26 NOTE — Progress Notes (Addendum)
  Subjective: 1 Day Post-Op Procedure(s) (LRB): AMPUTATION DIGIT-Right Middle Finger (Right) Patient reports pain as well-controlled.   Patient is well, and has had no acute complaints or problems Plan is to go Home after hospital stay. Negative for chest pain and shortness of breath Fever: no Gastrointestinal: negative for nausea and vomiting.    Patient is in good spirits with husband present at bedside.  Objective: Vital signs in last 24 hours: Temp:  [97.5 F (36.4 C)-99 F (37.2 C)] 97.9 F (36.6 C) (12/24 0958) Pulse Rate:  [56-88] 68 (12/24 0958) Resp:  [12-20] 16 (12/24 0958) BP: (101-150)/(59-111) 112/63 (12/24 0958) SpO2:  [96 %-100 %] 100 % (12/24 0958)  Intake/Output from previous day:  Intake/Output Summary (Last 24 hours) at 10/26/2020 1029 Last data filed at 10/26/2020 0900 Gross per 24 hour  Intake 815 ml  Output 1000 ml  Net -185 ml    Intake/Output this shift: Total I/O In: 240 [P.O.:240] Out: -   Labs: Recent Labs    10/24/20 1816 10/25/20 0353 10/26/20 0444  HGB 13.4 12.7 12.5   Recent Labs    10/25/20 0353 10/26/20 0444  WBC 5.2 6.3  RBC 3.88 3.79*  HCT 37.2 36.3  PLT 222 222   Recent Labs    10/25/20 0353 10/26/20 0444  NA 140 139  K 3.5 3.9  CL 104 105  CO2 27 27  BUN 13 13  CREATININE 0.61 0.63  GLUCOSE 100* 116*  CALCIUM 8.8* 8.6*   No results for input(s): LABPT, INR in the last 72 hours.   EXAM General - Patient is Alert, Appropriate and Oriented Dressing/Incision -clean, dry, no drainage Motor Function - intact, moving wrist and hand well on exam    Assessment/Plan: 1 Day Post-Op Procedure(s) (LRB): AMPUTATION DIGIT-Right Middle Finger (Right) Active Problems:   Finger infection   Finger osteomyelitis, right (HCC)  Estimated body mass index is 35.23 kg/m as calculated from the following:   Height as of this encounter: 5\' 6"  (1.676 m).   Weight as of this encounter: 99 kg.  Patient ok to discharge from  an orthopedic standpoint. Dressing was not changed but was reinforced.  Patient is to follow-up in 1 week with , PA-C at [facility] for staple removal. Patient is to keep bandage clean and dry.    [facility], PA-C [facility] Orthopaedic Surgery 10/26/2020, 10:29 AM

## 2020-10-26 NOTE — Progress Notes (Signed)
Discharge instructions reviewed with the patient and her husband. Patient sent out via wheelchair

## 2020-10-26 NOTE — Care Management (Signed)
Provided goodrx coupon for bactrim $8.71 from [facility]

## 2020-10-26 NOTE — Discharge Summary (Signed)
Physician Discharge Summary  Lindsay Frye DXA:[id number] DOB: 24-May-1966 DOA: 10/24/2020  PCP: Patient, No Pcp Per  Admit date: 10/24/2020 Discharge date: 10/26/2020  Admitted From: Home Disposition: Home   Recommendations for Outpatient Follow-up:  1. Follow up with PCP in 1-2 weeks 2. Follow-up with orthopedic 3. Please obtain BMP/CBC in one week 4. Please follow up on the following pending results: Surgical pathology  Home Health: No Equipment/Devices: None Discharge Condition: Stable CODE STATUS: Full Diet recommendation: Heart Healthy / Carb Modified   Brief/Interim Summary: history presenting to the emergency room with pain in my finger. Patient was seen in the ER on 12/19 and had I&D of paronychia and started on Keflex however she states that the area has become increasingly painful. Pain is throbbing and of severe intensity, unrelieved with OTC and prescription pain medication. She denies fever or chills. Imaging concerning for osteomyelitis of the distal tuft of third distal phalanx, orthopedic was consulted and she was taken to the OR for distal phalanx amputation.  Tolerated the procedure well. Next morning she was stable for discharge on p.o. antibiotics which were prescribed.  She received Rocephin and vancomycin while in the hospital and discharged on Keflex and Bactrim to complete a 7-day course. She will follow-up with orthopedic surgery next week for removal of staples.  Discharge Diagnoses:  Active Problems:   Finger infection   Finger osteomyelitis, right [facility])   Discharge Instructions  Discharge Instructions    Diet - low sodium heart healthy   Complete by: As directed    Discharge instructions   Complete by: As directed    It was pleasure taking care of you. Continue taking antibiotics for 5 more days and follow-up with your surgeon. Keep your wound dry, you can change the dressing if needed.   Increase activity slowly   Complete by: As directed     Leave dressing on - Keep it clean, dry, and intact until clinic visit   Complete by: As directed      Allergies as of 10/26/2020   No Known Allergies     Medication List    TAKE these medications   cephALEXin 500 MG capsule Commonly known as: KEFLEX Take 1 capsule (500 mg total) by mouth 3 (three) times daily.   oxyCODONE-acetaminophen 5-325 MG tablet Commonly known as: Percocet Take 1 tablet by mouth every 6 (six) hours as needed for severe pain.   sulfamethoxazole-trimethoprim 800-160 MG tablet Commonly known as: BACTRIM DS Take 1 tablet by mouth every 12 (twelve) hours for 7 days.            Discharge Care Instructions  (From admission, onward)         Start     Ordered   10/26/20 0000  Leave dressing on - Keep it clean, dry, and intact until clinic visit        10/26/20 1130          Follow-up Information    Evon Slack, PA-C Follow up in 1 week(s).   Specialties: Orthopedic Surgery, Emergency Medicine Contact information: [address] [place] [address] [phone]              No Known Allergies  Consultations:  Orthopedic  Procedures/Studies: DG Finger Middle Right  Result Date: 10/24/2020 CLINICAL DATA:  Right third digit infection, status post incision and drainage 10/21/2020 EXAM: RIGHT MIDDLE FINGER 2+V COMPARISON:  10/21/2020 FINDINGS: Frontal, oblique, lateral views of the right third digit are obtained. Soft tissue  defect compatible with prior incision and drainage. There is diffuse edema of the third digit. There is bony resorption along the volar medial aspect of the distal tuft of the third distal phalanx diagnostic of osteomyelitis. No acute fractures. No radiopaque foreign body. IMPRESSION: 1. Osteomyelitis of the distal tuft of the third distal phalanx. 2. Diffuse soft tissue swelling of the third digit, with evidence of previous incision and drainage as above. Electronically Signed   By: Sharlet Salina M.D.   On:  10/24/2020 17:54   DG Finger Middle Right  Result Date: 10/21/2020 CLINICAL DATA:  Patient with swelling of the third digit. EXAM: RIGHT MIDDLE FINGER 2+V COMPARISON:  None. FINDINGS: Normal anatomic alignment. No evidence for acute fracture or dislocation. No cortical osseous erosion. No soft tissue gas. IMPRESSION: No cortical osseous erosion or soft tissue gas. Electronically Signed   By: Annia Belt M.D.   On: 10/21/2020 12:13     Subjective: Patient was feeling better when seen today.  Ready to go home.  Husband at bedside.  Asking for some pain medications for discharge which were provided.  Discharge Exam: Vitals:   10/26/20 0514 10/26/20 0958  BP: 105/61 112/63  Pulse: (!) 56 68  Resp: 20 16  Temp: 98.1 F (36.7 C) 97.9 F (36.6 C)  SpO2: 99% 100%   Vitals:   10/25/20 1953 10/25/20 2323 10/26/20 0514 10/26/20 0958  BP: 120/72 (!) 101/59 105/61 112/63  Pulse: 66 68 (!) 56 68  Resp: 20 18 20 16   Temp: 98.5 F (36.9 C) 98.2 F (36.8 C) 98.1 F (36.7 C) 97.9 F (36.6 C)  TempSrc:  Oral Oral Oral  SpO2: 97% 96% 99% 100%  Weight:      Height:        General: Pt is alert, awake, not in acute distress Cardiovascular: RRR, S1/S2 +, no rubs, no gallops Respiratory: CTA bilaterally, no wheezing, no rhonchi Abdominal: Soft, NT, ND, bowel sounds + Extremities: no edema, no cyanosis, right hand with clean bandage.   The results of significant diagnostics from this hospitalization (including imaging, microbiology, ancillary and laboratory) are listed below for reference.    Microbiology: Recent Results (from the past 240 hour(s))  Resp Panel by RT-PCR (Flu A&B, Covid) Nasopharyngeal Swab     Status: None   Collection Time: 10/24/20 10:24 PM   Specimen: Nasopharyngeal Swab; Nasopharyngeal(NP) swabs in vial transport medium  Result Value Ref Range Status   SARS Coronavirus 2 by RT PCR NEGATIVE NEGATIVE Final    Comment: (NOTE) SARS-CoV-2 target nucleic acids are NOT  DETECTED.  The SARS-CoV-2 RNA is generally detectable in upper respiratory specimens during the acute phase of infection. The lowest concentration of SARS-CoV-2 viral copies this assay can detect is 138 copies/mL. A negative result does not preclude SARS-Cov-2 infection and should not be used as the sole basis for treatment or other patient management decisions. A negative result may occur with  improper specimen collection/handling, submission of specimen other than nasopharyngeal swab, presence of viral mutation(s) within the areas targeted by this assay, and inadequate number of viral copies(<138 copies/mL). A negative result must be combined with clinical observations, patient history, and epidemiological information. The expected result is Negative.  Fact Sheet for Patients:  10/26/20  Fact Sheet for Healthcare Providers:  BloggerCourse.com  This test is no t yet approved or cleared by the [place] FDA and  has been authorized for detection and/or diagnosis of SARS-CoV-2 by FDA under an Emergency Use Authorization (EUA).  This EUA will remain  in effect (meaning this test can be used) for the duration of the COVID-19 declaration under Section 564(b)(1) of the Act, 21 U.S.C.section 360bbb-3(b)(1), unless the authorization is terminated  or revoked sooner.       Influenza A by PCR NEGATIVE NEGATIVE Final   Influenza B by PCR NEGATIVE NEGATIVE Final    Comment: (NOTE) The Xpert Xpress SARS-CoV-2/FLU/RSV plus assay is intended as an aid in the diagnosis of influenza from Nasopharyngeal swab specimens and should not be used as a sole basis for treatment. Nasal washings and aspirates are unacceptable for Xpert Xpress SARS-CoV-2/FLU/RSV testing.  Fact Sheet for Patients: BloggerCourse.com  Fact Sheet for Healthcare Providers: SeriousBroker.it  This test is not yet  approved or cleared by the [place] FDA and has been authorized for detection and/or diagnosis of SARS-CoV-2 by FDA under an Emergency Use Authorization (EUA). This EUA will remain in effect (meaning this test can be used) for the duration of the COVID-19 declaration under Section 564(b)(1) of the Act, 21 U.S.C. section 360bbb-3(b)(1), unless the authorization is terminated or revoked.  Performed at [facility], [address] [place], [place] [address]      Labs: BNP (last 3 results) No results for input(s): BNP in the last 8760 hours. Basic Metabolic Panel: Recent Labs  Lab 10/24/20 1816 10/25/20 0353 10/26/20 0444  NA 138 140 139  K 4.3 3.5 3.9  CL 105 104 105  CO2 27 27 27   GLUCOSE 93 100* 116*  BUN 13 13 13   CREATININE 0.79 0.61 0.63  CALCIUM 8.9 8.8* 8.6*   Liver Function Tests: No results for input(s): AST, ALT, ALKPHOS, BILITOT, PROT, ALBUMIN in the last 168 hours. No results for input(s): LIPASE, AMYLASE in the last 168 hours. No results for input(s): AMMONIA in the last 168 hours. CBC: Recent Labs  Lab 10/24/20 1816 10/25/20 0353 10/26/20 0444  WBC 5.2 5.2 6.3  NEUTROABS 2.4  --   --   HGB 13.4 12.7 12.5  HCT 40.4 37.2 36.3  MCV 96.7 95.9 95.8  PLT 220 222 222   Cardiac Enzymes: No results for input(s): CKTOTAL, CKMB, CKMBINDEX, TROPONINI in the last 168 hours. BNP: Invalid input(s): POCBNP CBG: No results for input(s): GLUCAP in the last 168 hours. D-Dimer No results for input(s): DDIMER in the last 72 hours. Hgb A1c No results for input(s): HGBA1C in the last 72 hours. Lipid Profile No results for input(s): CHOL, HDL, LDLCALC, TRIG, CHOLHDL, LDLDIRECT in the last 72 hours. Thyroid function studies No results for input(s): TSH, T4TOTAL, T3FREE, THYROIDAB in the last 72 hours.  Invalid input(s): FREET3 Anemia work up No results for input(s): VITAMINB12, FOLATE, FERRITIN, TIBC, IRON, RETICCTPCT in the last 72  hours. Urinalysis    Component Value Date/Time   COLORURINE Amber 07/04/2013 1352   APPEARANCEUR Hazy 07/04/2013 1352   LABSPEC 1.036 07/04/2013 1352   PHURINE 5.0 07/04/2013 1352   GLUCOSEU Negative 07/04/2013 1352   HGBUR Negative 07/04/2013 1352   BILIRUBINUR Negative 07/04/2013 1352   KETONESUR Negative 07/04/2013 1352   PROTEINUR 30 mg/dL 09/03/2013 09/03/2013   NITRITE Negative 07/04/2013 1352   LEUKOCYTESUR Negative 07/04/2013 1352   Sepsis Labs Invalid input(s): PROCALCITONIN,  WBC,  LACTICIDVEN Microbiology Recent Results (from the past 240 hour(s))  Resp Panel by RT-PCR (Flu A&B, Covid) Nasopharyngeal Swab     Status: None   Collection Time: 10/24/20 10:24 PM   Specimen: Nasopharyngeal Swab; Nasopharyngeal(NP) swabs in vial transport medium  Result Value Ref Range Status   SARS Coronavirus 2 by RT PCR NEGATIVE NEGATIVE Final    Comment: (NOTE) SARS-CoV-2 target nucleic acids are NOT DETECTED.  The SARS-CoV-2 RNA is generally detectable in upper respiratory specimens during the acute phase of infection. The lowest concentration of SARS-CoV-2 viral copies this assay can detect is 138 copies/mL. A negative result does not preclude SARS-Cov-2 infection and should not be used as the sole basis for treatment or other patient management decisions. A negative result may occur with  improper specimen collection/handling, submission of specimen other than nasopharyngeal swab, presence of viral mutation(s) within the areas targeted by this assay, and inadequate number of viral copies(<138 copies/mL). A negative result must be combined with clinical observations, patient history, and epidemiological information. The expected result is Negative.  Fact Sheet for Patients:  BloggerCourse.comhttps://www.fda.gov/media/152166/download  Fact Sheet for Healthcare Providers:  SeriousBroker.ithttps://www.fda.gov/media/152162/download  This test is no t yet approved or cleared by the [place] FDA and  has been  authorized for detection and/or diagnosis of SARS-CoV-2 by FDA under an Emergency Use Authorization (EUA). This EUA will remain  in effect (meaning this test can be used) for the duration of the COVID-19 declaration under Section 564(b)(1) of the Act, 21 U.S.C.section 360bbb-3(b)(1), unless the authorization is terminated  or revoked sooner.       Influenza A by PCR NEGATIVE NEGATIVE Final   Influenza B by PCR NEGATIVE NEGATIVE Final    Comment: (NOTE) The Xpert Xpress SARS-CoV-2/FLU/RSV plus assay is intended as an aid in the diagnosis of influenza from Nasopharyngeal swab specimens and should not be used as a sole basis for treatment. Nasal washings and aspirates are unacceptable for Xpert Xpress SARS-CoV-2/FLU/RSV testing.  Fact Sheet for Patients: BloggerCourse.comhttps://www.fda.gov/media/152166/download  Fact Sheet for Healthcare Providers: SeriousBroker.ithttps://www.fda.gov/media/152162/download  This test is not yet approved or cleared by the [place] FDA and has been authorized for detection and/or diagnosis of SARS-CoV-2 by FDA under an Emergency Use Authorization (EUA). This EUA will remain in effect (meaning this test can be used) for the duration of the COVID-19 declaration under Section 564(b)(1) of the Act, 21 U.S.C. section 360bbb-3(b)(1), unless the authorization is terminated or revoked.  Performed at [facility], [address] [place], [place] [address]     Time coordinating discharge: Over 30 minutes  SIGNED:  Arnetha CourserSumayya Nidhi Jacome, MD  [facility] 10/26/2020, 11:31 AM  If 7PM-7AM, please contact night-coverage [email or web address]  This record has been created using Conservation officer, historic buildingsDragon voice recognition software. Errors have been sought and corrected,but may not always be located. Such creation errors do not reflect on the standard of care.

## 2020-11-01 LAB — SURGICAL PATHOLOGY

## 2020-11-06 ENCOUNTER — Encounter: Payer: Self-pay | Admitting: Orthopedic Surgery

## 2021-01-13 ENCOUNTER — Encounter: Payer: Self-pay | Admitting: Emergency Medicine

## 2021-01-13 ENCOUNTER — Emergency Department: Payer: Self-pay

## 2021-01-13 ENCOUNTER — Emergency Department
Admission: EM | Admit: 2021-01-13 | Discharge: 2021-01-14 | Disposition: A | Discharge status: Home / Self Care | Payer: Self-pay | Attending: Emergency Medicine | Admitting: Emergency Medicine

## 2021-01-13 ENCOUNTER — Other Ambulatory Visit: Payer: Self-pay

## 2021-01-13 DIAGNOSIS — S22080A Wedge compression fracture of T11-T12 vertebra, initial encounter for closed fracture: Secondary | ICD-10-CM | POA: Insufficient documentation

## 2021-01-13 DIAGNOSIS — W01198A Fall on same level from slipping, tripping and stumbling with subsequent striking against other object, initial encounter: Secondary | ICD-10-CM | POA: Insufficient documentation

## 2021-01-13 DIAGNOSIS — S22000A Wedge compression fracture of unspecified thoracic vertebra, initial encounter for closed fracture: Secondary | ICD-10-CM

## 2021-01-13 DIAGNOSIS — T1490XA Injury, unspecified, initial encounter: Secondary | ICD-10-CM

## 2021-01-13 DIAGNOSIS — R1084 Generalized abdominal pain: Secondary | ICD-10-CM | POA: Insufficient documentation

## 2021-01-13 DIAGNOSIS — S0012XA Contusion of left eyelid and periocular area, initial encounter: Secondary | ICD-10-CM | POA: Insufficient documentation

## 2021-01-13 DIAGNOSIS — M545 Low back pain, unspecified: Secondary | ICD-10-CM | POA: Insufficient documentation

## 2021-01-13 DIAGNOSIS — R519 Headache, unspecified: Secondary | ICD-10-CM | POA: Insufficient documentation

## 2021-01-13 DIAGNOSIS — F17200 Nicotine dependence, unspecified, uncomplicated: Secondary | ICD-10-CM | POA: Insufficient documentation

## 2021-01-13 LAB — BASIC METABOLIC PANEL
Anion gap: 7 (ref 5–15)
BUN: 13 mg/dL (ref 6–20)
CO2: 27 mmol/L (ref 22–32)
Calcium: 9 mg/dL (ref 8.9–10.3)
Chloride: 108 mmol/L (ref 98–111)
Creatinine, Ser: 0.6 mg/dL (ref 0.44–1.00)
GFR, Estimated: >60 mL/min (ref >60)
Glucose, Bld: 126 mg/dL — ABNORMAL HIGH (ref 70–99)
Potassium: 3.8 mmol/L (ref 3.5–5.1)
Sodium: 142 mmol/L (ref 135–145)

## 2021-01-13 MED ORDER — IOHEXOL 300 MG/ML  SOLN
100.0000 mL | Freq: Once | INTRAMUSCULAR | Status: AC | PRN
  Administered 2021-01-13: 100 mL via INTRAVENOUS
  Filled 2021-01-13: qty 100

## 2021-01-13 MED ORDER — SODIUM CHLORIDE 0.9 % IV BOLUS
1000.0000 mL | Freq: Once | INTRAVENOUS | Status: AC
  Administered 2021-01-13: 1000 mL via INTRAVENOUS

## 2021-01-13 MED ORDER — ONDANSETRON HCL 4 MG/2ML IJ SOLN
4.0000 mg | Freq: Once | INTRAMUSCULAR | Status: AC
  Administered 2021-01-13: 4 mg via INTRAVENOUS

## 2021-01-13 MED ORDER — ORPHENADRINE CITRATE ER 100 MG PO TB12: 100.0000 mg | ORAL_TABLET | Freq: Once | ORAL | Status: DC

## 2021-01-13 MED ORDER — PROMETHAZINE HCL 25 MG/ML IJ SOLN
12.5000 mg | Freq: Once | INTRAMUSCULAR | Status: AC
  Administered 2021-01-13: 12.5 mg via INTRAVENOUS
  Filled 2021-01-13: qty 1

## 2021-01-13 MED ORDER — METHOCARBAMOL 500 MG PO TABS
500.0000 mg | ORAL_TABLET | Freq: Once | ORAL | Status: AC
  Administered 2021-01-13: 500 mg via ORAL
  Filled 2021-01-13: qty 1

## 2021-01-13 MED ORDER — ONDANSETRON HCL 4 MG/2ML IJ SOLN
INTRAMUSCULAR | Status: AC
  Filled 2021-01-13: qty 2

## 2021-01-13 MED ORDER — MORPHINE SULFATE (PF) 4 MG/ML IV SOLN
4.0000 mg | Freq: Once | INTRAVENOUS | Status: AC
  Administered 2021-01-13: 4 mg via INTRAVENOUS
  Filled 2021-01-13: qty 1

## 2021-01-13 MED ORDER — HYDROCODONE-ACETAMINOPHEN 5-325 MG PO TABS
1.0000 | ORAL_TABLET | Freq: Once | ORAL | Status: AC
  Administered 2021-01-13: 1 via ORAL
  Filled 2021-01-13: qty 1

## 2021-01-13 NOTE — ED Notes (Signed)
Patient's mental status and lethargy improving with fluids. Patient is still sleepy but able to answer questions.

## 2021-01-13 NOTE — ED Notes (Signed)
CT tech called to this RN that patient is crying out that she is going to throw up and will not lay still because she is in pain. PA aware, new orders placed.
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
## 2021-01-13 NOTE — ED Notes (Addendum)
Gaby, NT redrew blood after lab called with hemolyzed sample. Samples sent to lab via flex tube station.

## 2021-01-13 NOTE — ED Notes (Signed)
Lab called to report hemolyzed sample. Will redraw labs.
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
## 2021-01-13 NOTE — ED Notes (Signed)
ED NP called lab to ask about blood samples and why they had not resulted in over an hour. Lab reported that they do not have the samples that had been previously sent down.  Lab to come draw blood and walk samples down to the lab.

## 2021-01-13 NOTE — ED Notes (Signed)
Patient transported to CT 
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
## 2021-01-13 NOTE — Progress Notes (Signed)
Orthopedic Tech Progress Note Patient Details:  Lindsay Frye 1966-06-28 [id number]  Patient ID: Lindsay Frye, female   DOB: 02-25-1966, 55 y.o.   MRN: [id number] I assisted the [employer] representative locate this patient.  Trinna Post 01/13/2021, 11:06 PM

## 2021-01-14 ENCOUNTER — Emergency Department: Payer: Self-pay

## 2021-01-14 MED ORDER — HYDROCODONE-ACETAMINOPHEN 5-325 MG PO TABS
1.0000 | ORAL_TABLET | Freq: Once | ORAL | Status: AC
  Administered 2021-01-14: 1 via ORAL
  Filled 2021-01-14: qty 1

## 2021-01-14 MED ORDER — HYDROCODONE-ACETAMINOPHEN 5-325 MG PO TABS: 1.0000 | ORAL_TABLET | ORAL | 0 refills | Status: DC | PRN

## 2021-01-14 NOTE — ED Notes (Signed)
Pt TLSO brace placed by this nurse with assistance of spouse- pt more alert and able to stand after TLSO brace and ambulate to in-room commode with 2 person mod assist.  Pt now back in bed awaiting Radiology for standing thoracic spine (with TLSO brace on -- radiology notified that pt is ready)

## 2021-01-14 NOTE — ED Notes (Signed)
Pt has returned from xray at this time.  
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
## 2021-01-14 NOTE — ED Notes (Signed)
Patient transported to X-ray 

## 2021-01-14 NOTE — ED Notes (Signed)
Pt sitting up in high fowlers resting with eyes closed-- VSS.  Pt arousable to verbal stimuli but remains lethargic.  Continuous cardiac and pulse ox maintained.   Spouse at bedside.

## 2021-01-14 NOTE — ED Notes (Signed)
TLSO brace in place during discharge

## 2021-01-24 ENCOUNTER — Other Ambulatory Visit: Payer: Self-pay

## 2021-01-24 DIAGNOSIS — N3001 Acute cystitis with hematuria: Secondary | ICD-10-CM | POA: Insufficient documentation

## 2021-01-24 DIAGNOSIS — X58XXXD Exposure to other specified factors, subsequent encounter: Secondary | ICD-10-CM | POA: Insufficient documentation

## 2021-01-24 DIAGNOSIS — F17200 Nicotine dependence, unspecified, uncomplicated: Secondary | ICD-10-CM | POA: Insufficient documentation

## 2021-01-24 DIAGNOSIS — S32010G Wedge compression fracture of first lumbar vertebra, subsequent encounter for fracture with delayed healing: Secondary | ICD-10-CM | POA: Insufficient documentation
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
## 2021-01-25 ENCOUNTER — Emergency Department: Payer: Self-pay

## 2021-01-25 ENCOUNTER — Emergency Department
Admission: EM | Admit: 2021-01-25 | Discharge: 2021-01-25 | Disposition: A | Discharge status: Home / Self Care | Payer: Self-pay | Attending: Emergency Medicine | Admitting: Emergency Medicine

## 2021-01-25 DIAGNOSIS — R112 Nausea with vomiting, unspecified: Secondary | ICD-10-CM

## 2021-01-25 DIAGNOSIS — M4856XG Collapsed vertebra, not elsewhere classified, lumbar region, subsequent encounter for fracture with delayed healing: Secondary | ICD-10-CM

## 2021-01-25 DIAGNOSIS — N3001 Acute cystitis with hematuria: Secondary | ICD-10-CM

## 2021-01-25 LAB — COMPREHENSIVE METABOLIC PANEL
ALT: 10 U/L (ref 0–44)
AST: 12 U/L — ABNORMAL LOW (ref 15–41)
Albumin: 3.9 g/dL (ref 3.5–5.0)
Alkaline Phosphatase: 73 U/L (ref 38–126)
Anion gap: 10 (ref 5–15)
BUN: 14 mg/dL (ref 6–20)
CO2: 24 mmol/L (ref 22–32)
Calcium: 9.2 mg/dL (ref 8.9–10.3)
Chloride: 105 mmol/L (ref 98–111)
Creatinine, Ser: 0.64 mg/dL (ref 0.44–1.00)
GFR, Estimated: >60 mL/min (ref >60)
Glucose, Bld: 96 mg/dL (ref 70–99)
Potassium: 3.8 mmol/L (ref 3.5–5.1)
Sodium: 139 mmol/L (ref 135–145)
Total Bilirubin: 0.4 mg/dL (ref 0.3–1.2)
Total Protein: 7.4 g/dL (ref 6.5–8.1)

## 2021-01-25 LAB — CBC
HCT: 39.9 % (ref 36.0–46.0)
Hemoglobin: 13.2 g/dL (ref 12.0–15.0)
MCH: 32 pg (ref 26.0–34.0)
MCHC: 33.1 g/dL (ref 30.0–36.0)
MCV: 96.6 fL (ref 80.0–100.0)
Platelets: 271 10*3/uL (ref 150–400)
RBC: 4.13 MIL/uL (ref 3.87–5.11)
RDW: 13 % (ref 11.5–15.5)
WBC: 5.7 10*3/uL (ref 4.0–10.5)
nRBC: 0 % (ref 0.0–0.2)

## 2021-01-25 LAB — URINALYSIS, COMPLETE (UACMP) WITH MICROSCOPIC
Glucose, UA: NEGATIVE mg/dL
Hgb urine dipstick: NEGATIVE
Ketones, ur: 5 mg/dL — AB
Nitrite: NEGATIVE
Protein, ur: 100 mg/dL — AB
Specific Gravity, Urine: >1.046 — ABNORMAL HIGH (ref 1.005–1.030)
pH: 5 (ref 5.0–8.0)

## 2021-01-25 LAB — LIPASE, BLOOD: Lipase: 60 U/L — ABNORMAL HIGH (ref 11–51)

## 2021-01-25 MED ORDER — MORPHINE SULFATE (PF) 4 MG/ML IV SOLN
4.0000 mg | Freq: Once | INTRAVENOUS | Status: AC
  Administered 2021-01-25: 4 mg via INTRAVENOUS
  Filled 2021-01-25: qty 1

## 2021-01-25 MED ORDER — TRAMADOL HCL 50 MG PO TABS: 50.0000 mg | ORAL_TABLET | Freq: Four times a day (QID) | ORAL | 0 refills | Status: DC | PRN

## 2021-01-25 MED ORDER — ONDANSETRON 4 MG PO TBDP
4.0000 mg | ORAL_TABLET | Freq: Once | ORAL | Status: AC
  Administered 2021-01-25: 4 mg via ORAL
  Filled 2021-01-25: qty 1

## 2021-01-25 MED ORDER — ONDANSETRON HCL 4 MG/2ML IJ SOLN
4.0000 mg | Freq: Once | INTRAMUSCULAR | Status: AC
  Administered 2021-01-25: 4 mg via INTRAVENOUS
  Filled 2021-01-25: qty 2

## 2021-01-25 MED ORDER — IOHEXOL 300 MG/ML  SOLN
125.0000 mL | Freq: Once | INTRAMUSCULAR | Status: AC | PRN
  Administered 2021-01-25: 125 mL via INTRAVENOUS

## 2021-01-25 MED ORDER — SODIUM CHLORIDE 0.9 % IV SOLN
1.0000 g | Freq: Once | INTRAVENOUS | Status: AC
  Administered 2021-01-25: 1 g via INTRAVENOUS
  Filled 2021-01-25: qty 10

## 2021-01-25 MED ORDER — ONDANSETRON 4 MG PO TBDP: 4.0000 mg | ORAL_TABLET | Freq: Three times a day (TID) | ORAL | 0 refills | Status: DC | PRN

## 2021-01-25 MED ORDER — LACTATED RINGERS IV BOLUS
1000.0000 mL | Freq: Once | INTRAVENOUS | Status: AC
  Administered 2021-01-25: 1000 mL via INTRAVENOUS

## 2021-01-25 MED ORDER — CEPHALEXIN 500 MG PO CAPS: 500.0000 mg | ORAL_CAPSULE | Freq: Two times a day (BID) | ORAL | 0 refills | Status: AC

## 2021-01-25 NOTE — ED Notes (Signed)
Patient provided a phone and assistance onto toilet by Shary Key, RN.

## 2021-01-25 NOTE — ED Notes (Signed)
Pt assisted with placement of back brace previously prescribed for vertebrae fracture by this RN and Hollie Salk. Pt educated on importance of wearing brace at all times and initial application of brace. Pt verbalized understanding and using teach back method described placement of back brace. Pt also educated on movement techniques to help decrease strain on back when moving.

## 2021-01-25 NOTE — ED Notes (Signed)
Patient provided warm blankets by patient access.

## 2021-01-25 NOTE — ED Notes (Signed)
ED Provider at bedside. 

## 2021-01-25 NOTE — Discharge Instructions (Signed)
You have been seen in the Emergency Department (ED)  today for back pain.  Back pain has many possible causes some are related to muscles while others have more serious causes. Even though you were checked carefully today and your exam and evaluation were reassuring, problems may develop later or continue to unfold. Therefore it is imperative that you follow up with doctor closely for further evaluation.  Follow-up with your doctor in 1 day for further evaluation.  When should you call for help?  Call your doctor now or seek immediate medical care if:  You have new or worsening numbness in your legs.  You have new or worsening weakness in your legs. (This could make it hard to stand up.)  You lose control of your bladder or bowels or if you are unable to urinate. You have numbness of your groin or buttock region If you develop a fever  Watch closely for changes in your health, and be sure to contact your doctor if:  Your pain gets worse.  You are not getting better after 2 weeks.  How can you care for yourself at home?  Take pain medicines exactly as directed.  If the doctor gave you a prescription medicine for pain, take it as prescribed.  If you are not taking a prescription pain medicine, ask your doctor if you can take an over-the-counter medicine like tylenol or ibuprofen. Sit or lie in positions that are most comfortable and reduce your pain. Try one of these positions when you lie down:  Lie on your back with your knees bent and supported by large pillows.  Lie on the floor with your legs on the seat of a sofa or chair.  Lie on your side with your knees and hips bent and a pillow between your legs.  Lie on your stomach if it does not make pain worse. Do not sit up in bed, and avoid soft couches and twisted positions. Bed rest can help relieve pain at first, but it delays healing. Avoid bed rest after the first day of back pain.  Change positions every 30 minutes. If you must sit for  long periods of time, take breaks from sitting. Get up and walk around, or lie in a comfortable position.  Try using a heating pad on a low or medium setting for 15 to 20 minutes every 2 or 3 hours. Try a warm shower in place of one session with the heating pad.  You can also try an ice pack for 10 to 15 minutes every 2 to 3 hours. Put a thin cloth between the ice pack and your skin.  Take short walks several times a day. You can start with 5 to 10 minutes, 3 or 4 times a day, and work up to longer walks. Walk on level surfaces and avoid hills and stairs until your back is better.  Return to work and other activities as soon as you can. Continued rest without activity is usually not good for your back.  To prevent future back pain, do exercises to stretch and strengthen your back and stomach. Learn how to use good posture, safe lifting techniques, and proper body mechanics.

## 2021-01-25 NOTE — ED Provider Notes (Signed)
[facility] Emergency Department Provider Note  ____________________________________________  Time seen: Approximately 3:37 AM  I have reviewed the triage vital signs and the nursing notes.   HISTORY  Chief Complaint Vomiting   HPI Lindsay Frye is a 55 y.o. female who presents for evaluation of nausea and vomiting.  Patient reports going to [place] earlier today and having deep-fried jalapenos.  She started vomiting after leaving the restaurant.  Has had several episodes of nonbloody nonbilious emesis.  She is complaining of mild cramping diffuse abdominal pain associated with it.  No diarrhea, no constipation, she is passing flatus, no abdominal distention, no prior history of SBO.  No chest pain or shortness of breath, no fever or chills, no dysuria or hematuria.   No past medical history on file.  Patient Active Problem List   Diagnosis Date Noted  . Finger infection 10/24/2020  . 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 ABDOMINAL SURGERY    . AMPUTATION Right 10/25/2020   Procedure: AMPUTATION DIGIT-Right Middle Finger;  Surgeon: Kennedy BuckerMenz, Michael, MD;  Location: [facility] ORS;  Service: Orthopedics;  Laterality: Right;  . skull plate      Prior to Admission medications   Medication Sig Start Date End Date Taking? Authorizing Provider  cephALEXin (KEFLEX) 500 MG capsule Take 1 capsule (500 mg total) by mouth 2 (two) times daily for 7 days. 01/25/21 02/01/21 Yes Veronese, [place], MD  ondansetron (ZOFRAN ODT) 4 MG disintegrating tablet Take 1 tablet (4 mg total) by mouth every 8 (eight) hours as needed. 01/25/21  Yes Don PerkingVeronese, [place], MD  traMADol (ULTRAM) 50 MG tablet Take 1 tablet (50 mg total) by mouth every 6 (six) hours as needed. 01/25/21 01/25/22 Yes Nita SickleVeronese, [place], MD    Allergies Patient has no known allergies.  No family history on file.  Social History Social  History   Tobacco Use  . Smoking status: Current Every Day Smoker  . Smokeless tobacco: Never Used  Substance Use Topics  . Alcohol use: Yes  . Drug use: No    Review of Systems  Constitutional: Negative for fever. Eyes: Negative for visual changes. ENT: Negative for sore throat. Neck: No neck pain  Cardiovascular: Negative for chest pain. Respiratory: Negative for shortness of breath. Gastrointestinal: + cramping abdominal pain, nausea, and vomiting. No diarrhea. Genitourinary: Negative for dysuria. Musculoskeletal: Negative for back pain. Skin: Negative for rash. Neurological: Negative for headaches, weakness or numbness. Psych: No SI or HI  ____________________________________________   PHYSICAL EXAM:  VITAL SIGNS: ED Triage Vitals  Enc Vitals Group     BP 01/24/21 2336 (!) 140/91     Pulse Rate 01/24/21 2336 82     Resp 01/24/21 2336 20     Temp 01/24/21 2336 98.6 F (37 C)     Temp Source 01/24/21 2336 Oral     SpO2 01/24/21 2336 99 %     Weight 01/24/21 2337 289 lb 14.5 oz (131.5 kg)     Height 01/24/21 2337 5\' 6"  (1.676 m)     Head Circumference --      Peak Flow --      Pain Score 01/24/21 2336 8     Pain Loc --      Pain Edu? --      Excl. in GC? --     Constitutional: Alert and oriented, actively vomiting.  HEENT:      Head: Normocephalic and atraumatic.  Eyes: Conjunctivae are normal. Sclera is non-icteric.       Mouth/Throat: Mucous membranes are moist.       Neck: Supple with no signs of meningismus. Cardiovascular: Regular rate and rhythm. No murmurs, gallops, or rubs. 2+ symmetrical distal pulses are present in all extremities. No JVD. Respiratory: Normal respiratory effort. Lungs are clear to auscultation bilaterally.  Gastrointestinal: Soft, mild tenderness to palpation worse on the lower quadrant, and non distended with positive bowel sounds. No rebound or guarding. Genitourinary: No CVA tenderness. Musculoskeletal:  No edema,  cyanosis, or erythema of extremities. Neurologic: Normal speech and language. Face is symmetric. Moving all extremities. No gross focal neurologic deficits are appreciated. Skin: Skin is warm, dry and intact. No rash noted. Psychiatric: Mood and affect are normal. Speech and behavior are normal.  ____________________________________________   LABS (all labs ordered are listed, but only abnormal results are displayed)  Labs Reviewed  COMPREHENSIVE METABOLIC PANEL - Abnormal; Notable for the following components:      Result Value   AST 12 (*)    All other components within normal limits  LIPASE, BLOOD - Abnormal; Notable for the following components:   Lipase 60 (*)    All other components within normal limits  URINALYSIS, COMPLETE (UACMP) WITH MICROSCOPIC - Abnormal; Notable for the following components:   Color, Urine AMBER (*)    APPearance CLOUDY (*)    Specific Gravity, Urine >1.046 (*)    Bilirubin Urine SMALL (*)    Ketones, ur 5 (*)    Protein, ur 100 (*)    Leukocytes,Ua TRACE (*)    Bacteria, UA MANY (*)    All other components within normal limits  URINE CULTURE  CBC   ____________________________________________  EKG  none  ____________________________________________  RADIOLOGY  I have personally reviewed the images performed during this visit and I agree with the Radiologist's read.   Interpretation by Radiologist:  CT ABDOMEN PELVIS W CONTRAST  Result Date: 01/25/2021 CLINICAL DATA:  Vomiting, abdominal pain with emesis EXAM: CT ABDOMEN AND PELVIS WITH CONTRAST TECHNIQUE: Multidetector CT imaging of the abdomen and pelvis was performed using the standard protocol following bolus administration of intravenous contrast. CONTRAST:  OMNIPAQUE IOHEXOL 300 MG/ML  SOLN COMPARISON:  CT 01/13/2021 FINDINGS: Lower chest: Dependent atelectatic changes. Lung bases otherwise clear. Normal heart size. No pericardial effusion. Hepatobiliary: No worrisome focal liver  lesions. Smooth liver surface contour. Normal hepatic attenuation. Normal gallbladder and biliary tree. Pancreas: No pancreatic ductal dilatation or surrounding inflammatory changes. Spleen: Normal in size. No concerning splenic lesions. Adrenals/Urinary Tract: Normal adrenal glands. Kidneys are normally located with symmetric enhancement and excretion. No suspicious renal lesion, urolithiasis or hydronephrosis. Urinary bladder is unremarkable for the degree of distention at the time of exam. Stomach/Bowel: Distal esophagus, stomach and duodenal sweep are unremarkable. No small bowel wall thickening or dilatation. No evidence of obstruction. A normal appendix is visualized. No colonic dilatation or wall thickening. Scattered colonic diverticula without focal inflammation to suggest diverticulitis. Vascular/Lymphatic: No significant vascular findings are present. No enlarged abdominal or pelvic lymph nodes. Reproductive: Uterus and bilateral adnexa are unremarkable. Other: No abdominopelvic free fluid or free gas. No bowel containing hernias. Postsurgical changes from prior vertical midline incision. 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 No involvement of the posterior cortex. No retropulsion. Surrounding paravertebral swelling and edema. No  visible canal hematoma. IMPRESSION: 1. 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 No involvement of the posterior cortex. No retropulsion. No significant canal impingement. Persistent paravertebral swelling and edema. 2. No other acute abnormality in the abdomen or pelvis. 3. Colonic diverticulosis without evidence of diverticulitis. Electronically  Signed   By: Kreg Shropshire M.D.   On: 01/25/2021 04:43      ____________________________________________   PROCEDURES  Procedure(s) performed:yes  Procedures Critical Care performed:  None ____________________________________________   INITIAL IMPRESSION / ASSESSMENT AND PLAN / ED COURSE  55 y.o. female who presents for evaluation of nausea and vomiting and lower abdominal cramping that started after patient had deep fried jalapenos from a [place] earlier today.  Patient actively vomiting but otherwise well-appearing, has mild tenderness to palpation mostly on the lower quadrants with no rebound or guarding.  She is afebrile with normal vital signs.  Ddx food poisoning, viral gastroenteritis, pancreatitis, UTI, pyelonephritis, SBO, constipation, appendicitis, diverticulitis, gallbladder pathology.  Labs show normal white count with normal LFTs and borderline lipase at 60.  UA concerning for UTI.  CT done suspicious tenderness was most on the lower quadrants and showed no acute intra-abdominal cause of patient's pain. It did show the subacute vertebral body compression fracture which seems worse (from 40% to 60% height loss). No retropulsion or canal impigement. No signs or symptoms of cauda equina.  Urine sent for culture.  Patient given a dose of IV Rocephin.  After fluids and Zofran patient no longer vomiting.  Again discussed the importance of using the TLSO brace which patient has been using intermittently.  I discussed that she should treat the brace as if it is a cast and keep it on at all times other than for sleeping.  She has an appointment coming up with neurosurgery next week which I recommended her keeping it.  Discussed signs and symptoms of cauda equina and recommended return to the emergency room.  Discussed signs and symptoms of pyelonephritis and recommended return as well.  Otherwise she is to follow-up with her primary care doctor.  She be discharged home with a  prescription for Keflex.       _____________________________________________ Please note:  Patient was evaluated in Emergency Department today for the symptoms described in the history of present illness. Patient was evaluated in the context of the global COVID-19 pandemic, which necessitated consideration that the patient might be at risk for infection with the SARS-CoV-2 virus that causes COVID-19. Institutional protocols and algorithms that pertain to the evaluation of patients at risk for COVID-19 are in a state of rapid change based on information released by regulatory bodies including the CDC and federal and state organizations. These policies and algorithms were followed during the patient's care in the ED.  Some ED evaluations and interventions may be delayed as a result of limited staffing during the pandemic.   [place] Controlled Substance Database was reviewed by me. ____________________________________________   FINAL CLINICAL IMPRESSION(S) / ED DIAGNOSES   Final diagnoses:  Acute cystitis with hematuria  Non-intractable vomiting with nausea, unspecified vomiting type  Non-traumatic compression fracture of L1 lumbar vertebra with delayed healing, subsequent encounter      NEW MEDICATIONS STARTED DURING THIS VISIT:  ED Discharge Orders         Ordered    cephALEXin (KEFLEX) 500 MG capsule  2 times  daily        01/25/21 0502    traMADol (ULTRAM) 50 MG tablet  Every 6 hours PRN        01/25/21 0502    ondansetron (ZOFRAN ODT) 4 MG disintegrating tablet  Every 8 hours PRN        01/25/21 0502           Note:  This document was prepared using Dragon voice recognition software and may include unintentional dictation errors.    Don Perking, [place], MD 01/25/21 [phone]

## 2021-01-25 NOTE — ED Notes (Signed)
Report to Penni Bombard, Charity fundraiser. Patient reports she is comfortable at this time. Husband at bedside.

## 2021-01-25 NOTE — ED Notes (Signed)
Patient reports eating some unknown meat from a [place] grocery store this morning, that had been sitting on the counter overnight. The patient reports vomiting constantly since. Patient reports dark brown bile in her vomit. Patient denies diarrhea, but reports abdominal cramping.

## 2021-01-25 NOTE — ED Notes (Signed)
Pt in CT at this time.

## 2021-01-26 LAB — URINE CULTURE

## 2021-01-30 ENCOUNTER — Other Ambulatory Visit: Payer: Self-pay

## 2021-01-30 ENCOUNTER — Encounter: Payer: Self-pay | Admitting: Emergency Medicine

## 2021-01-30 ENCOUNTER — Emergency Department
Admission: EM | Admit: 2021-01-30 | Discharge: 2021-01-30 | Disposition: A | Discharge status: Home / Self Care | Payer: Self-pay | Attending: Emergency Medicine | Admitting: Emergency Medicine

## 2021-01-30 ENCOUNTER — Emergency Department: Payer: Self-pay

## 2021-01-30 DIAGNOSIS — F17200 Nicotine dependence, unspecified, uncomplicated: Secondary | ICD-10-CM | POA: Insufficient documentation

## 2021-01-30 DIAGNOSIS — M545 Low back pain, unspecified: Secondary | ICD-10-CM | POA: Insufficient documentation

## 2021-01-30 LAB — URINALYSIS, COMPLETE (UACMP) WITH MICROSCOPIC
Bilirubin Urine: NEGATIVE
Glucose, UA: NEGATIVE mg/dL
Hgb urine dipstick: NEGATIVE
Ketones, ur: NEGATIVE mg/dL
Nitrite: NEGATIVE
Protein, ur: NEGATIVE mg/dL
Specific Gravity, Urine: 1.004 — ABNORMAL LOW (ref 1.005–1.030)
pH: 7 (ref 5.0–8.0)

## 2021-01-30 MED ORDER — TRAMADOL HCL 50 MG PO TABS: 50.0000 mg | ORAL_TABLET | Freq: Four times a day (QID) | ORAL | 0 refills | Status: AC | PRN

## 2021-01-30 NOTE — ED Triage Notes (Signed)
Pt comes into the ED via POV c/o back pain and medication refill for her tramadol.  Pt states she has a known back fracture and she ran out of her tramadol and now the pain is uncontrolled.  Pt has even and unlabored respirations at this time.  PT does present in her back brace.

## 2021-01-30 NOTE — ED Provider Notes (Signed)
[facility]-EMERGENCY DEPARTMENT  ____________________________________________  Time seen: Approximately 2:17 PM  I have reviewed the triage vital signs and the nursing notes.   HISTORY  Chief Complaint Back Pain and Medication Refill   Historian Patient     HPI Lindsay Frye is a 55 y.o. female presents to the emergency department with acute on chronic low back pain.  Patient states that she is wanting a refill of her tramadol.  She states that she has been wearing her TLSO brace as directed by neurosurgery.  At patient's last emergency department encounter, ED provider noted worsening compression fracture from 40% to 60% height loss.  Patient denies new numbness or tingling of the lower extremities.  She reports that vomiting has improved since being seen on 01/25/2021.  No chest pain, chest tightness or abdominal pain.  No other alleviating measures have been attempted.   History reviewed. No pertinent past medical history.   Immunizations up to date:  Yes.     History reviewed. No pertinent past medical history.  Patient Active Problem List   Diagnosis Date Noted  . Finger infection 10/24/2020  . 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 ABDOMINAL SURGERY    . AMPUTATION Right 10/25/2020   Procedure: AMPUTATION DIGIT-Right Middle Finger;  Surgeon: Kennedy Bucker, MD;  Location: [facility] ORS;  Service: Orthopedics;  Laterality: Right;  . skull plate      Prior to Admission medications   Medication Sig Start Date End Date Taking? Authorizing Provider  cephALEXin (KEFLEX) 500 MG capsule Take 1 capsule (500 mg total) by mouth 2 (two) times daily for 7 days. 01/25/21 02/01/21  Nita Sickle, MD  ondansetron (ZOFRAN ODT) 4 MG disintegrating tablet Take 1 tablet (4 mg total) by mouth every 8 (eight) hours as needed. 01/25/21   Nita Sickle, MD  traMADol (ULTRAM) 50 MG tablet Take 1 tablet (50 mg total) by mouth  every 6 (six) hours as needed for up to 5 days. 01/30/21 02/04/21  Orvil Feil, PA-C    Allergies Patient has no known allergies.  History reviewed. No pertinent family history.  Social History Social History   Tobacco Use  . Smoking status: Current Every Day Smoker  . Smokeless tobacco: Never Used  Substance Use Topics  . Alcohol use: Yes  . Drug use: No     Review of Systems  Constitutional: No fever/chills Eyes:  No discharge ENT: No upper respiratory complaints. Respiratory: no cough. No SOB/ use of accessory muscles to breath Gastrointestinal:   No nausea, no vomiting.  No diarrhea.  No constipation. Musculoskeletal: Patient has low back pain.  Skin: Negative for rash, abrasions, lacerations, ecchymosis.   ____________________________________________   PHYSICAL EXAM:  VITAL SIGNS: ED Triage Vitals [01/30/21 1158]  Enc Vitals Group     BP 126/82     Pulse Rate 62     Resp 20     Temp 97.9 F (36.6 C)     Temp Source Oral     SpO2 99 %     Weight 289 lb (131.1 kg)     Height 5\' 6"  (1.676 m)     Head Circumference      Peak Flow      Pain Score 10     Pain Loc      Pain Edu?      Excl. in GC?      Constitutional: Alert and oriented. Well appearing and in no acute  distress. Eyes: Conjunctivae are normal. PERRL. EOMI. Head: Atraumatic. ENT: Cardiovascular: Normal rate, regular rhythm. Normal S1 and S2.  Good peripheral circulation. Respiratory: Normal respiratory effort without tachypnea or retractions. Lungs CTAB. Good air entry to the bases with no decreased or absent breath sounds Gastrointestinal: Bowel sounds x 4 quadrants. Soft and nontender to palpation. No guarding or rigidity. No distention. Musculoskeletal: Full range of motion to all extremities. No obvious deformities noted Neurologic:  Normal for age. No gross focal neurologic deficits are appreciated.  Skin:  Skin is warm, dry and intact. No rash noted. Psychiatric: Mood and affect are  normal for age. Speech and behavior are normal.   ____________________________________________   LABS (all labs ordered are listed, but only abnormal results are displayed)  Labs Reviewed  URINALYSIS, COMPLETE (UACMP) WITH MICROSCOPIC - Abnormal; Notable for the following components:      Result Value   Color, Urine STRAW (*)    APPearance CLEAR (*)    Specific Gravity, Urine 1.004 (*)    Leukocytes,Ua MODERATE (*)    Bacteria, UA MANY (*)    All other components within normal limits   ____________________________________________  EKG   ____________________________________________  RADIOLOGY   DG Lumbar Spine 2-3 Views  Result Date: 01/30/2021 CLINICAL DATA:  Low back pain, known fracture EXAM: LUMBAR SPINE - 2-3 VIEW COMPARISON:  Correlation made with CT January 25, 2021 FINDINGS: T12 fracture appears similar to prior CT. Levocurvature at the thoracolumbar junction. Vertebral body heights are otherwise maintained. Mild disc space narrowing at L2-L3, L3-L4, and L4-L5. Mild lumbar facet hypertrophy. IMPRESSION: No significant interval change in appearance of T12 fracture. Electronically Signed   By: Guadlupe Spanish M.D.   On: 01/30/2021 15:05    ____________________________________________    PROCEDURES  Procedure(s) performed:     Procedures     Medications - No data to display   ____________________________________________   INITIAL IMPRESSION / ASSESSMENT AND PLAN / ED COURSE  Pertinent labs & imaging results that were available during my care of the patient were reviewed by me and considered in my medical decision making (see chart for details).      Assessment and Plan:  Low back pain:  55 year old female presents to the emergency department with persistent low back pain.  Vital signs were reassuring at triage.  On physical exam, patient had no CVA tenderness and she reported that her nausea and vomiting have resolved.  Patient had leukocytes and many  bacteria identified on urinalysis.  Patient is still taking Keflex.  Advised her to continue taking medication as directed.  Patient's tramadol was refilled.  I cautioned patient that she needed to follow-up with her primary care provider or establish care with pain management if back pain persists. All patient questions were answered.     ____________________________________________  FINAL CLINICAL IMPRESSION(S) / ED DIAGNOSES  Final diagnoses:  Acute bilateral low back pain without sciatica      NEW MEDICATIONS STARTED DURING THIS VISIT:  ED Discharge Orders         Ordered    traMADol (ULTRAM) 50 MG tablet  Every 6 hours PRN        01/30/21 1547              This chart was dictated using voice recognition software/Dragon. Despite best efforts to proofread, errors can occur which can change the meaning. Any change was purely unintentional.     Orvil Feil, PA-C 01/30/21 1621    Minna Antis, MD  02/01/21 0949  

## 2021-12-09 ENCOUNTER — Other Ambulatory Visit: Payer: Self-pay

## 2021-12-09 ENCOUNTER — Emergency Department
Admission: EM | Admit: 2021-12-09 | Discharge: 2021-12-09 | Disposition: A | Discharge status: Home / Self Care | Payer: Self-pay | Attending: Emergency Medicine | Admitting: Emergency Medicine

## 2021-12-09 DIAGNOSIS — K0381 Cracked tooth: Secondary | ICD-10-CM | POA: Insufficient documentation

## 2021-12-09 DIAGNOSIS — K029 Dental caries, unspecified: Secondary | ICD-10-CM | POA: Insufficient documentation

## 2021-12-09 DIAGNOSIS — K0889 Other specified disorders of teeth and supporting structures: Secondary | ICD-10-CM

## 2021-12-09 DIAGNOSIS — S025XXA Fracture of tooth (traumatic), initial encounter for closed fracture: Secondary | ICD-10-CM

## 2021-12-09 MED ORDER — AMOXICILLIN-POT CLAVULANATE 875-125 MG PO TABS
1.0000 | ORAL_TABLET | Freq: Once | ORAL | Status: AC
  Administered 2021-12-09: 1 via ORAL
  Filled 2021-12-09: qty 1

## 2021-12-09 MED ORDER — HYDROCODONE-ACETAMINOPHEN 5-325 MG PO TABS: 1.0000 | ORAL_TABLET | ORAL | 0 refills | Status: DC | PRN

## 2021-12-09 MED ORDER — HYDROCODONE-ACETAMINOPHEN 5-325 MG PO TABS
1.0000 | ORAL_TABLET | ORAL | Status: AC
  Administered 2021-12-09: 1 via ORAL
  Filled 2021-12-09: qty 1

## 2021-12-09 MED ORDER — AMOXICILLIN-POT CLAVULANATE 875-125 MG PO TABS
1.0000 | ORAL_TABLET | Freq: Two times a day (BID) | ORAL | 0 refills | Status: AC
  Filled 2021-12-11: qty 14, 7d supply, fill #0

## 2021-12-09 NOTE — Discharge Instructions (Signed)
Please take pain medication as needed for severe pain.  Take antibiotics as prescribed for 7 days.  Call dental clinic tomorrow to schedule follow-up appointment.  Return to the ER for any increasing pain swelling warmth redness fevers or difficulty swallowing.

## 2021-12-09 NOTE — ED Triage Notes (Signed)
Pt comes with c/o pain to left side of face. Pt states possible bad tooth. Pt states this started two weeks ago.

## 2021-12-09 NOTE — ED Provider Notes (Signed)
[facility] EMERGENCY DEPARTMENT Provider Note   CSN: [id number] Arrival date & time: 12/09/21  1716     History  Chief Complaint  Patient presents with   Dental Pain    Lindsay Frye is a 56 y.o. female presents to the emergency department valuation of dental pain.  Her left lower tooth has been decayed for quite some time, she states she fractured it a couple weeks ago and she has been having increasing pain and recently swelling for couple days along the left lower jaw.  She denies any trouble swallowing, fevers.  She has not been taking any medications for pain nor any antibiotics for the infection.  She denies any drainage in her mouth.  No neck pain or swelling. HPI     Home Medications Prior to Admission medications   Medication Sig Start Date End Date Taking? Authorizing Provider  amoxicillin-clavulanate (AUGMENTIN) 875-125 MG tablet Take 1 tablet by mouth every 12 (twelve) hours for 7 days. 12/09/21 12/16/21 Yes Evon Slack, PA-C  HYDROcodone-acetaminophen (NORCO) 5-325 MG tablet Take 1 tablet by mouth every 4 (four) hours as needed for moderate pain. 12/09/21  Yes Evon Slack, PA-C  ondansetron (ZOFRAN ODT) 4 MG disintegrating tablet Take 1 tablet (4 mg total) by mouth every 8 (eight) hours as needed. 01/25/21   Nita Sickle, MD      Allergies    Patient has no known allergies.    Review of Systems   Review of Systems  Physical Exam Updated Vital Signs BP 138/77    Pulse 73    Temp 97.9 F (36.6 C) (Oral)    Resp 18    Ht 5\' 6"  (1.676 m)    Wt 90.7 kg    SpO2 99%    BMI 32.28 kg/m  Physical Exam Constitutional:      General: She is not in acute distress.    Appearance: She is well-developed.  HENT:     Head: Normocephalic and atraumatic.     Jaw: No trismus.     Right Ear: External ear normal.     Left Ear: External ear normal.     Nose: Nose normal.     Mouth/Throat:     Mouth: No oral lesions.     Dentition: Dental caries  and dental abscesses present.     Pharynx: Uvula midline. No uvula swelling.   Cardiovascular:     Rate and Rhythm: Normal rate.     Heart sounds: No murmur heard.   No friction rub. No gallop.  Pulmonary:     Effort: Pulmonary effort is normal. No respiratory distress.     Breath sounds: Normal breath sounds.  Musculoskeletal:     Cervical back: Normal range of motion and neck supple.  Skin:    General: Skin is warm and dry.  Neurological:     Mental Status: She is alert and oriented to person, place, and time.  Psychiatric:        Behavior: Behavior normal.        Thought Content: Thought content normal.    ED Results / Procedures / Treatments   Labs (all labs ordered are listed, but only abnormal results are displayed) Labs Reviewed - No data to display  EKG None  Radiology No results found.  Procedures Procedures    Medications Ordered in ED Medications  amoxicillin-clavulanate (AUGMENTIN) 875-125 MG per tablet 1 tablet (has no administration in time range)  HYDROcodone-acetaminophen (NORCO/VICODIN) 5-325 MG per tablet  1 tablet (has no administration in time range)    ED Course/ Medical Decision Making/ A&P                           Medical Decision Making Risk Prescription drug management.   56 year old female with left lower dental caries/abscess/fractured tooth.  Vital signs are stable, afebrile.  Tolerating p.o. well.  She is given a prescription for Norco and Augmentin and will call dental clinic tomorrow to schedule follow-up appointment.  She understands signs symptoms return to the ER for. Final Clinical Impression(s) / ED Diagnoses Final diagnoses:  Pain, dental  Dental caries  Closed fracture of tooth, initial encounter    Rx / DC Orders ED Discharge Orders          Ordered    amoxicillin-clavulanate (AUGMENTIN) 875-125 MG tablet  Every 12 hours        12/09/21 1843    HYDROcodone-acetaminophen (NORCO) 5-325 MG tablet  Every 4 hours PRN         12/09/21 1843              Evon Slack, PA-C 12/09/21 1847    Chesley Noon, MD 12/09/21 2351

## 2021-12-11 ENCOUNTER — Other Ambulatory Visit: Payer: Self-pay

## 2021-12-12 ENCOUNTER — Ambulatory Visit: Payer: Medicaid Other | Admitting: Pharmacy Technician

## 2021-12-12 ENCOUNTER — Other Ambulatory Visit: Payer: Self-pay

## 2021-12-12 DIAGNOSIS — Z79899 Other long term (current) drug therapy: Secondary | ICD-10-CM

## 2021-12-12 NOTE — Progress Notes (Signed)
Met with patient completed financial assistance application for [facility] due to recent hospital visit.  [facility] patient request, financial assistance application and supporting financial documentation faxed to Patient Accounting at [facility] in [place].    Completed [facility] application and contract.  Patient agreed to all terms of the [facility] contract.    Patient approved to receive medication assistance at [facility] until time for re-certification in 1612, and as long as eligibility criteria continues to be met.    Provided patient with community resource material based on her particular needs.    [facility]

## 2021-12-16 ENCOUNTER — Other Ambulatory Visit: Payer: Self-pay

## 2022-02-11 ENCOUNTER — Other Ambulatory Visit: Payer: Self-pay

## 2022-02-26 ENCOUNTER — Other Ambulatory Visit: Payer: Self-pay

## 2022-03-07 ENCOUNTER — Other Ambulatory Visit: Payer: Self-pay

## 2022-06-11 ENCOUNTER — Other Ambulatory Visit: Payer: Self-pay
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
## 2023-04-10 ENCOUNTER — Emergency Department: Payer: Medicaid Other

## 2023-04-10 ENCOUNTER — Other Ambulatory Visit: Payer: Self-pay

## 2023-04-10 ENCOUNTER — Encounter: Payer: Self-pay | Admitting: Emergency Medicine

## 2023-04-10 ENCOUNTER — Emergency Department
Admission: EM | Admit: 2023-04-10 | Discharge: 2023-04-10 | Disposition: A | Discharge status: Home / Self Care | Payer: Medicaid Other | Attending: Emergency Medicine | Admitting: Emergency Medicine

## 2023-04-10 DIAGNOSIS — S8992XA Unspecified injury of left lower leg, initial encounter: Secondary | ICD-10-CM | POA: Diagnosis present

## 2023-04-10 DIAGNOSIS — S8002XA Contusion of left knee, initial encounter: Secondary | ICD-10-CM | POA: Diagnosis not present

## 2023-04-10 DIAGNOSIS — W228XXA Striking against or struck by other objects, initial encounter: Secondary | ICD-10-CM | POA: Insufficient documentation

## 2023-04-10 MED ORDER — IBUPROFEN 600 MG PO TABS: 600.0000 mg | ORAL_TABLET | Freq: Four times a day (QID) | ORAL | 0 refills | Status: DC | PRN

## 2023-04-10 MED ORDER — IBUPROFEN 600 MG PO TABS
600.0000 mg | ORAL_TABLET | Freq: Once | ORAL | Status: AC
  Administered 2023-04-10: 600 mg via ORAL
  Filled 2023-04-10: qty 1

## 2023-04-10 MED ORDER — IBUPROFEN 600 MG PO TABS
600.0000 mg | ORAL_TABLET | Freq: Four times a day (QID) | ORAL | 0 refills | Status: AC | PRN
  Filled 2023-04-10: qty 30, 8d supply, fill #0

## 2023-04-10 NOTE — Discharge Instructions (Signed)
Follow-up with Dr. Joice Lofts who is on-call for orthopedics if you continue to have problems or not improving in 10 days.  His office information is listed on your discharge papers.  Ice and elevation when possible.  Leave the compression bandage alone for the next 3 days and then you may remove it.  During this time you will need to take sponge baths to avoid getting this wet.

## 2023-04-10 NOTE — ED Provider Notes (Signed)
[facility] Provider Note    Event Date/Time   First MD Initiated Contact with Patient 04/10/23 0809     (approximate)   History   Knee Injury (Left)   HPI  Lindsay Frye is a 57 y.o. female   presents to the ED with complaint of knee pain.  Patient reports that she hit her knee yesterday on the end of a couch and has continued to have pain since that time.  She states is very painful to bear weight.    Physical Exam   Triage Vital Signs: ED Triage Vitals  Enc Vitals Group     BP 04/10/23 0806 114/76     Pulse Rate 04/10/23 0806 70     Resp 04/10/23 0806 18     Temp 04/10/23 0806 98 F (36.7 C)     Temp Source 04/10/23 0806 Oral     SpO2 04/10/23 0806 96 %     Weight --      Height 04/10/23 0804 5\' 6"  (1.676 m)     Head Circumference --      Peak Flow --      Pain Score 04/10/23 0804 8     Pain Loc --      Pain Edu? --      Excl. in GC? --     Most recent vital signs: Vitals:   04/10/23 0806  BP: 114/76  Pulse: 70  Resp: 18  Temp: 98 F (36.7 C)  SpO2: 96%     General: Awake, no distress.  CV:  Good peripheral perfusion.  Resp:  Normal effort.  Abd:  No distention.  Other:  Motion of left knee there is no gross deformity however is moderately tender to palpation anteriorly and on the lateral aspect.  Minimal soft tissue edema no skin discoloration is present.  Skin is intact.  Range of motion is slow and guarded secondary to pain.  Negative for effusion.   ED Results / Procedures / Treatments   Labs (all labs ordered are listed, but only abnormal results are displayed) Labs Reviewed - No data to display   RADIOLOGY Left knee x-ray images were reviewed by myself independent of the radiologist and questionable tibial plateau fracture that the radiologist also questions is a possibility.  He recommends a CT scan which was done and radiology reports that there is no fracture in this area.    PROCEDURES:  Critical Care  performed:   Procedures   MEDICATIONS ORDERED IN ED: Medications  ibuprofen (ADVIL) tablet 600 mg (600 mg Oral Given 04/10/23 1019)     IMPRESSION / MDM / ASSESSMENT AND PLAN / ED COURSE  I reviewed the triage vital signs and the nursing notes.   Differential diagnosis includes, but is not limited to, contusion, sprain, fracture, dislocation, internal derangement left knee.  57 year old female presents to the ED with complaint of left knee pain after hitting her knee on a sofa last night.  Initial x-ray films was questionable of a tibial plateau fracture however CT scan is negative.  Patient was placed in a knee immobilizer initially however this did not fit her leg.  She was then placed in a Jones wrap which was bulky and she is instructed to leave this on for approximately 3 to 4 days before removing it.  She is to follow-up with Dr. Joice Lofts if any continued problems.  A prescription for ibuprofen was sent to the pharmacy for her to begin taking and she  is encouraged to ice and elevate her knee to help with pain control.      Patient's presentation is most consistent with acute complicated illness / injury requiring diagnostic workup.  FINAL CLINICAL IMPRESSION(S) / ED DIAGNOSES   Final diagnoses:  Contusion of left knee, initial encounter     Rx / DC Orders   ED Discharge Orders          Ordered    ibuprofen (ADVIL) 600 MG tablet  Every 6 hours PRN,   Status:  Discontinued        04/10/23 0955    ibuprofen (ADVIL) 600 MG tablet  Every 6 hours PRN        04/10/23 1024             Note:  This document was prepared using Dragon voice recognition software and may include unintentional dictation errors.   Tommi Rumps, PA-C 04/10/23 1409    Pilar Jarvis, MD 04/10/23 [phone]

## 2023-04-10 NOTE — ED Triage Notes (Signed)
Pt states left knee pain and swelling. Pt states yesterday she hit her left knee on the end of the couch. Pt states it was hurting and she could not sleep.

## 2023-04-10 NOTE — ED Notes (Signed)
See triage note Presents with left knee pain  States she hit her knee on the sofa yesterday  States she is having increased pain this am  Denies any fall

## 2023-11-16 ENCOUNTER — Other Ambulatory Visit: Payer: Self-pay

## 2023-11-16 MED ORDER — HYDROCHLOROTHIAZIDE 25 MG PO TABS
25.0000 mg | ORAL_TABLET | Freq: Every day | ORAL | 0 refills | Status: AC
Start: 2023-11-16 — End: ?
  Filled 2023-11-16 (×2): qty 60, 60d supply, fill #0

## 2023-11-17 ENCOUNTER — Other Ambulatory Visit: Payer: Self-pay

## 2023-12-03 ENCOUNTER — Emergency Department: Payer: Medicaid Other

## 2023-12-03 ENCOUNTER — Other Ambulatory Visit: Payer: Self-pay

## 2023-12-03 ENCOUNTER — Emergency Department
Admission: EM | Admit: 2023-12-03 | Discharge: 2023-12-03 | Disposition: A | Discharge status: Home / Self Care | Payer: Medicaid Other | Attending: Emergency Medicine | Admitting: Emergency Medicine

## 2023-12-03 ENCOUNTER — Encounter: Payer: Self-pay | Admitting: Emergency Medicine

## 2023-12-03 DIAGNOSIS — M25562 Pain in left knee: Secondary | ICD-10-CM | POA: Insufficient documentation

## 2023-12-03 DIAGNOSIS — W19XXXA Unspecified fall, initial encounter: Secondary | ICD-10-CM | POA: Diagnosis not present

## 2023-12-03 DIAGNOSIS — X501XXA Overexertion from prolonged static or awkward postures, initial encounter: Secondary | ICD-10-CM | POA: Diagnosis not present

## 2023-12-03 DIAGNOSIS — M25561 Pain in right knee: Secondary | ICD-10-CM | POA: Diagnosis present

## 2023-12-03 DIAGNOSIS — M7989 Other specified soft tissue disorders: Secondary | ICD-10-CM | POA: Diagnosis not present

## 2023-12-03 DIAGNOSIS — M545 Low back pain, unspecified: Secondary | ICD-10-CM | POA: Diagnosis not present

## 2023-12-03 DIAGNOSIS — I10 Essential (primary) hypertension: Secondary | ICD-10-CM | POA: Insufficient documentation

## 2023-12-03 NOTE — ED Triage Notes (Signed)
Pt to ED via POV for fall 2 weeks ago. Pt states that she fell and twisted her left knee. Pt is having pain in her left knee.

## 2023-12-03 NOTE — ED Provider Triage Note (Signed)
Emergency Medicine Provider Triage Evaluation Note  Lindsay Frye , a 58 y.o. female  was evaluated in triage.  Pt complains of left knee pain after falling on knees about 2 weeks ago. No relief with ACE bandage and rest.  Physical Exam  Ht 5\' 7"  (1.702 m)   Wt 136.1 kg   BMI 46.99 kg/m  Gen:   Awake, no distress   Resp:  Normal effort  MSK:   Moves extremities without difficulty  Other:    Medical Decision Making  Medically screening exam initiated at 3:26 PM.  Appropriate orders placed.  Lindsay Frye was informed that the remainder of the evaluation will be completed by another provider, this initial triage assessment does not replace that evaluation, and the importance of remaining in the ED until their evaluation is complete.    Chinita Pester, FNP 12/03/23 1545

## 2023-12-03 NOTE — ED Provider Notes (Signed)
[facility] Provider Note    Event Date/Time   First MD Initiated Contact with Patient 12/03/23 1621     (approximate)   History   Fall and Knee Pain   HPI  Lindsay Frye is a 58 y.o. female with history of hypertension presents emergency department stating that 2 weeks ago she slipped and fell into a actual split, the left knee was positioned and bent underneath her, has noticed pain in this knee is continued to have trouble bearing weight, now has swelling in the lower extremity, is concerned as it is more thin normal.  Patient does have a prescription for HCTZ that she uses for blood pressure also is to help with fluid.  Denies head injury      Physical Exam   Triage Vital Signs: ED Triage Vitals  Encounter Vitals Group     BP 12/03/23 1526 128/78     Systolic BP Percentile --      Diastolic BP Percentile --      Pulse Rate 12/03/23 1526 64     Resp 12/03/23 1526 16     Temp 12/03/23 1528 98 F (36.7 C)     Temp src --      SpO2 12/03/23 1526 100 %     Weight 12/03/23 1525 300 lb (136.1 kg)     Height 12/03/23 1525 5\' 7"  (1.702 m)     Head Circumference --      Peak Flow --      Pain Score 12/03/23 1525 8     Pain Loc --      Pain Education --      Exclude from Growth Chart --     Most recent vital signs: Vitals:   12/03/23 1526 12/03/23 1528  BP: 128/78   Pulse: 64   Resp: 16   Temp:  98 F (36.7 C)  SpO2: 100%      General: Awake, no distress.   CV:  Good peripheral perfusion. regular rate and  rhythm Resp:  Normal effort.  Abd:  No distention.   Other:  Left knee is tender to palpation, lumbar spine is tender to palpation, left lower extremity has some swelling and tenderness along the calf and posterior aspect of the left knee, neurovascular appears to be intact  ED Results / Procedures / Treatments   Labs (all labs ordered are listed, but only abnormal results are displayed) Labs Reviewed - No data to  display   EKG     RADIOLOGY X-ray of the pelvis, lumbar spine, left knee, and ultrasound left lower extremity    PROCEDURES:   Procedures Chief Complaint  Patient presents with   Fall   Knee Pain      MEDICATIONS ORDERED IN ED: Medications - No data to display   IMPRESSION / MDM / ASSESSMENT AND PLAN / ED COURSE  I reviewed the triage vital signs and the nursing notes.                              Differential diagnosis includes, but is not limited to, strain, fracture, contusion, DVT, edema   Patient's presentation is most consistent with acute illness / injury with system symptoms.   Patient appears to be well, she has had no difficulty breathing and no shortness of breath, will do x-ray of the pelvis, lumbar spine and left knee.  Ultrasound of the left lower extremity to rule  out DVT  X-ray of the lumbar spine, pelvis, and left knee were independently reviewed interpreted by me as being negative for any acute abnormality  ReSound left lower extremity for DVT, did independently review and interpret radiologist readings being negative for DVT  Did explain these findings to the patient.  Weight loss was discussed, she is to follow-up with orthopedics.  Return emergency department if worsening.  For the fluid she can take  her HCTZ today, cut back on her salt, try to diet and exercise.  Patient is in agreement treatment plan.  Discharged in stable condition.     FINAL CLINICAL IMPRESSION(S) / ED DIAGNOSES   Final diagnoses:  Acute pain of both knees  Left leg swelling  Fall, initial encounter     Rx / DC Orders   ED Discharge Orders     None        Note:  This document was prepared using Dragon voice recognition software and may include unintentional dictation errors.    Faythe Ghee, PA-C 12/03/23 Renetta Chalk, MD 12/04/23 Marlyne Beards

## 2023-12-19 ENCOUNTER — Emergency Department: Payer: Medicaid Other

## 2023-12-19 ENCOUNTER — Emergency Department
Admission: EM | Admit: 2023-12-19 | Discharge: 2023-12-19 | Disposition: A | Discharge status: Home / Self Care | Payer: Medicaid Other | Attending: Emergency Medicine | Admitting: Emergency Medicine

## 2023-12-19 ENCOUNTER — Encounter: Payer: Self-pay | Admitting: Emergency Medicine

## 2023-12-19 DIAGNOSIS — M25511 Pain in right shoulder: Secondary | ICD-10-CM | POA: Insufficient documentation

## 2023-12-19 DIAGNOSIS — S299XXA Unspecified injury of thorax, initial encounter: Secondary | ICD-10-CM | POA: Insufficient documentation

## 2023-12-19 DIAGNOSIS — W19XXXA Unspecified fall, initial encounter: Secondary | ICD-10-CM

## 2023-12-19 DIAGNOSIS — S60211A Contusion of right wrist, initial encounter: Secondary | ICD-10-CM | POA: Insufficient documentation

## 2023-12-19 DIAGNOSIS — S0990XA Unspecified injury of head, initial encounter: Secondary | ICD-10-CM | POA: Diagnosis not present

## 2023-12-19 DIAGNOSIS — F419 Anxiety disorder, unspecified: Secondary | ICD-10-CM | POA: Insufficient documentation

## 2023-12-19 DIAGNOSIS — Y92002 Bathroom of unspecified non-institutional (private) residence single-family (private) house as the place of occurrence of the external cause: Secondary | ICD-10-CM | POA: Diagnosis not present

## 2023-12-19 DIAGNOSIS — W182XXA Fall in (into) shower or empty bathtub, initial encounter: Secondary | ICD-10-CM | POA: Insufficient documentation

## 2023-12-19 DIAGNOSIS — S6991XA Unspecified injury of right wrist, hand and finger(s), initial encounter: Secondary | ICD-10-CM | POA: Diagnosis present

## 2023-12-19 DIAGNOSIS — M25512 Pain in left shoulder: Secondary | ICD-10-CM | POA: Diagnosis not present

## 2023-12-19 LAB — COMPREHENSIVE METABOLIC PANEL
ALT: 19 U/L (ref 0–44)
AST: 18 U/L (ref 15–41)
Albumin: 3.6 g/dL (ref 3.5–5.0)
Alkaline Phosphatase: 65 U/L (ref 38–126)
Anion gap: 8 (ref 5–15)
BUN: 20 mg/dL (ref 6–20)
CO2: 28 mmol/L (ref 22–32)
Calcium: 9.1 mg/dL (ref 8.9–10.3)
Chloride: 103 mmol/L (ref 98–111)
Creatinine, Ser: 0.66 mg/dL (ref 0.44–1.00)
GFR, Estimated: >60 mL/min (ref >60)
Glucose, Bld: 105 mg/dL — ABNORMAL HIGH (ref 70–99)
Potassium: 3.9 mmol/L (ref 3.5–5.1)
Sodium: 139 mmol/L (ref 135–145)
Total Bilirubin: 0.5 mg/dL (ref 0.0–1.2)
Total Protein: 7.2 g/dL (ref 6.5–8.1)

## 2023-12-19 LAB — CBC
HCT: 32.1 % — ABNORMAL LOW (ref 36.0–46.0)
Hemoglobin: 10.5 g/dL — ABNORMAL LOW (ref 12.0–15.0)
MCH: 29.4 pg (ref 26.0–34.0)
MCHC: 32.7 g/dL (ref 30.0–36.0)
MCV: 89.9 fL (ref 80.0–100.0)
Platelets: 193 10*3/uL (ref 150–400)
RBC: 3.57 MIL/uL — ABNORMAL LOW (ref 3.87–5.11)
RDW: 13.6 % (ref 11.5–15.5)
WBC: 4.8 10*3/uL (ref 4.0–10.5)
nRBC: 0 % (ref 0.0–0.2)

## 2023-12-19 LAB — PROTIME-INR
INR: 1 (ref 0.8–1.2)
Prothrombin Time: 13.6 s (ref 11.4–15.2)

## 2023-12-19 LAB — APTT: aPTT: 25 s (ref 24–36)

## 2023-12-19 MED ORDER — MORPHINE SULFATE (PF) 4 MG/ML IV SOLN
4.0000 mg | INTRAVENOUS | Status: AC | PRN
Start: 2023-12-19 — End: 2023-12-19
  Administered 2023-12-19 (×2): 4 mg via INTRAVENOUS
  Filled 2023-12-19 (×2): qty 1

## 2023-12-19 MED ORDER — IOHEXOL 300 MG/ML  SOLN
100.0000 mL | Freq: Once | INTRAMUSCULAR | Status: AC | PRN
  Administered 2023-12-19: 100 mL via INTRAVENOUS

## 2023-12-19 MED ORDER — ONDANSETRON HCL 4 MG/2ML IJ SOLN
4.0000 mg | Freq: Once | INTRAMUSCULAR | Status: AC
  Administered 2023-12-19: 4 mg via INTRAVENOUS
  Filled 2023-12-19: qty 2

## 2023-12-19 NOTE — ED Provider Notes (Signed)
 [facility] Provider Note    Event Date/Time   First MD Initiated Contact with Patient 12/19/23 0815     (approximate)   History   Fall   HPI  Lindsay Frye is a 58 y.o. female who presents after a fall.  Patient reports she tripped in her bathroom, fell forward hit her head on the toilet and complains of pain in her shoulders bilaterally which is severe.  She also complains of injuring her anterior chest.  Additionally she thinks that she injured her knees bilaterally.  History is difficult as patient is quite anxious     Physical Exam   Triage Vital Signs: ED Triage Vitals [12/19/23 0813]  Encounter Vitals Group     BP      Systolic BP Percentile      Diastolic BP Percentile      Pulse      Resp      Temp      Temp src      SpO2      Weight 136.1 kg (300 lb)     Height 1.676 m (5\' 6" )     Head Circumference      Peak Flow      Pain Score      Pain Loc      Pain Education      Exclude from Growth Chart     Most recent vital signs: Vitals:   12/19/23 1104 12/19/23 1122  BP: 121/78   Pulse:    Temp:  98.2 F (36.8 C)  SpO2:       General: Awake, c-collar in place, very anxious CV:  Good peripheral perfusion.  Mild sternal tenderness to palpation Resp:  Normal effort.  Clear to auscultation bilaterally Abd:  No distention.  Overall soft, reassuring exam Other:  No pain with axial load on the hips, however patient complains of some discomfort with bending of the knees, no significant effusion, warm and well-perfused distally.   ED Results / Procedures / Treatments   Labs (all labs ordered are listed, but only abnormal results are displayed) Labs Reviewed  CBC - Abnormal; Notable for the following components:      Result Value   RBC 3.57 (*)    Hemoglobin 10.5 (*)    HCT 32.1 (*)    All other components within normal limits  COMPREHENSIVE METABOLIC PANEL - Abnormal; Notable for the following components:   Glucose, Bld 105  (*)    All other components within normal limits  APTT  PROTIME-INR     EKG     RADIOLOGY CT head viewed interpret by me, no acute abnormality confirmed radiology CT cervical spine, chest abdomen pelvis without acute abnormality    PROCEDURES:  Critical Care performed:   Procedures   MEDICATIONS ORDERED IN ED: Medications  morphine (PF) 4 MG/ML injection 4 mg (4 mg Intravenous Given 12/19/23 0949)  ondansetron (ZOFRAN) injection 4 mg (4 mg Intravenous Given 12/19/23 0839)  iohexol (OMNIPAQUE) 300 MG/ML solution 100 mL (100 mLs Intravenous Contrast Given 12/19/23 1000)     IMPRESSION / MDM / ASSESSMENT AND PLAN / ED COURSE  I reviewed the triage vital signs and the nursing notes. Patient's presentation is most consistent with acute presentation with potential threat to life or bodily function.  Patient presents with fall as detailed above, she has multiple complaints and appears in significant discomfort.  Will send for CT head cervical spine CT chest abdomen pelvis, obtain x-rays of  the knees bilaterally, will treat with IV morphine, IV Zofran, obtain labs and reevaluate.  ----------------------------------------- 10:48 AM on 12/19/2023 ----------------------------------------- CT scans are reassuring, c-collar removed, patient is now complaining of right wrist pain, will obtain x-rays   X-rays of the wrist are reassuring, workup overall quite reassuring, patient is feeling significantly improved, appropriate discharge at this time with outpatient follow-up as needed, she and her family agree with this plan.     FINAL CLINICAL IMPRESSION(S) / ED DIAGNOSES   Final diagnoses:  Fall, initial encounter  Contusion of right wrist, initial encounter  Injury of head, initial encounter     Rx / DC Orders   ED Discharge Orders     None        Note:  This document was prepared using Dragon voice recognition software and may include unintentional dictation  errors.   Jene Every, MD 12/19/23 1235

## 2023-12-19 NOTE — ED Notes (Signed)
 Entered room to d/c pt, pt is gone without d/c instructions.

## 2023-12-19 NOTE — ED Triage Notes (Signed)
 Pt BIB EMS for fall in bathroom.  Struck forehead on toilet.  Pt arrives in a c collar.  IV access established, of fentanyl given.  Pt is complaining of bilateral shoulder pain, right worse than left.

## 2023-12-19 NOTE — ED Notes (Signed)
 Patient is in room with husband. Patient will now be discharged.

## 2024-03-01 ENCOUNTER — Encounter (INDEPENDENT_AMBULATORY_CARE_PROVIDER_SITE_OTHER): Payer: Self-pay | Admitting: Vascular Surgery

## 2024-03-01 ENCOUNTER — Ambulatory Visit (INDEPENDENT_AMBULATORY_CARE_PROVIDER_SITE_OTHER): Admitting: Vascular Surgery

## 2024-03-01 VITALS — BP 105/71 | HR 73 | Resp 18 | Ht 66.0 in | Wt 300.0 lb

## 2024-03-01 DIAGNOSIS — M7989 Other specified soft tissue disorders: Secondary | ICD-10-CM | POA: Diagnosis not present

## 2024-03-01 DIAGNOSIS — G8929 Other chronic pain: Secondary | ICD-10-CM

## 2024-03-01 DIAGNOSIS — M25562 Pain in left knee: Secondary | ICD-10-CM | POA: Diagnosis not present

## 2024-03-01 DIAGNOSIS — I10 Essential (primary) hypertension: Secondary | ICD-10-CM | POA: Insufficient documentation

## 2024-03-01 NOTE — Assessment & Plan Note (Signed)
 The patient complains of significant left knee pain and instability with multiple falls.  I would recommend referral to an orthopedic surgeon for further evaluation of this.

## 2024-03-01 NOTE — Assessment & Plan Note (Signed)
 Recommend:  I have had a long discussion with the patient regarding swelling and why it  causes symptoms.  Patient will begin continue graduated compression on a daily basis, and a prescription was given. The patient will  wear the stockings first thing in the morning and removing them in the evening. The patient is instructed specifically not to sleep in the stockings.   In addition, behavioral modification will be initiated.  This will include frequent elevation, use of over the counter pain medications and exercise such as walking.  Consideration for a lymph pump will also be made based upon the effectiveness of conservative therapy.  This would help to improve the edema control and prevent sequela such as ulcers and infections   Patient should undergo duplex ultrasound of the venous system to ensure that DVT or reflux is not present.  The patient will follow-up with me after the ultrasound.

## 2024-03-01 NOTE — Assessment & Plan Note (Signed)
 Weight loss would be of benefit for improving her lower extremity swelling and instability.

## 2024-03-01 NOTE — Assessment & Plan Note (Signed)
 blood pressure control important in reducing the progression of atherosclerotic disease. On appropriate oral medications.

## 2024-03-01 NOTE — Progress Notes (Signed)
 Patient ID: Lindsay Frye, female   DOB: [birth year], 58 y.o.   MRN: [id number]  Chief Complaint  Patient presents with   New Patient (Initial Visit)    np. consult. LE edema. bender, abby.    HPI Lindsay Frye is a 58 y.o. female.  I am asked to see the patient by Dr. Sharlon Deacon for evaluation of lower extremity swelling.  The patient reports longstanding swelling in both lower extremities.  Over the last couple of years, she has had a lot of left knee instability with falls.  She has a difficult time getting up when she falls and has a lot of pain in her left knee.  She reports she has not seen an orthopedic surgeon.  She does not have any wounds or ulceration.  She was putting compression socks about a month ago and this has resulted in a marked improvement in the swelling in her lower leg.  She continues to have swelling in the upper part of the leg.  The left leg is the more severely affected of the 2 legs.  No previous history of DVT or superficial thrombophlebitis to her knowledge.     History reviewed. No pertinent past medical history.  Past Surgical History:  Procedure Laterality Date   ABDOMINAL SURGERY     AMPUTATION Right 10/25/2020   Procedure: AMPUTATION DIGIT-Right Middle Finger;  Surgeon: Molli Angelucci, MD;  Location: [facility] ORS;  Service: Orthopedics;  Laterality: Right;   skull plate      Family History No history of bleeding or clotting disorders No aneurysms   Social History   Tobacco Use   Smoking status: Every Day   Smokeless tobacco: Never  Substance Use Topics   Alcohol use: Yes   Drug use: No    No Known Allergies  Current Outpatient Medications  Medication Sig Dispense Refill   hydrochlorothiazide  (HYDRODIURIL ) 25 MG tablet Take 1 tablet (25 mg total) by mouth daily. 60 tablet 0   hydrOXYzine (ATARAX) 25 MG tablet Take 25 mg by mouth at bedtime as needed.     ibuprofen  (ADVIL ) 600 MG tablet Take 1 tablet (600 mg total) by mouth every 6 (six) hours  as needed. 30 tablet 0   sertraline (ZOLOFT) 100 MG tablet Take 100 mg by mouth daily.     traMADol  (ULTRAM ) 50 MG tablet Take 50 mg by mouth.     No current facility-administered medications for this visit.      REVIEW OF SYSTEMS (Negative unless checked)  Constitutional: [] Weight loss  [] Fever  [] Chills Cardiac: [] Chest pain   [] Chest pressure   [] Palpitations   [] Shortness of breath when laying flat   [] Shortness of breath at rest   [x] Shortness of breath with exertion. Vascular:  [] Pain in legs with walking   [] Pain in legs at rest   [] Pain in legs when laying flat   [] Claudication   [] Pain in feet when walking  [] Pain in feet at rest  [] Pain in feet when laying flat   [] History of DVT   [] Phlebitis   [x] Swelling in legs   [] Varicose veins   [] Non-healing ulcers Pulmonary:   [] Uses home oxygen   [] Productive cough   [] Hemoptysis   [] Wheeze  [] COPD   [] Asthma Neurologic:  [] Dizziness  [] Blackouts   [] Seizures   [] History of stroke   [] History of TIA  [] Aphasia   [] Temporary blindness   [] Dysphagia   [] Weakness or numbness in arms   [] Weakness or numbness in legs Musculoskeletal:  [  x]Arthritis   [] Joint swelling   [x] Joint pain   [] Low back pain Hematologic:  [] Easy bruising  [] Easy bleeding   [] Hypercoagulable state   [] Anemic  [] Hepatitis Gastrointestinal:  [] Blood in stool   [] Vomiting blood  [] Gastroesophageal reflux/heartburn   [] Abdominal pain Genitourinary:  [] Chronic kidney disease   [] Difficult urination  [] Frequent urination  [] Burning with urination   [] Hematuria Skin:  [] Rashes   [] Ulcers   [] Wounds Psychological:  [] History of anxiety   []  History of major depression.    Physical Exam BP 105/71   Pulse 73   Resp 18   Ht 5\' 6"  (1.676 m)   Wt 300 lb (136.1 kg)   LMP 05/28/2015   BMI 48.42 kg/m  Gen:  WD/WN, NAD. Obese  Head: [place]/AT, No temporalis wasting.  Ear/Nose/Throat: Hearing grossly intact, nares w/o erythema or drainage, oropharynx w/o Erythema/Exudate Eyes:  Conjunctiva clear, sclera non-icteric  Neck: trachea midline.  No JVD.  Pulmonary:  Good air movement, respirations not labored, no use of accessory muscles  Cardiac: RRR, no JVD Vascular:  Vessel Right Left  Radial Palpable Palpable                                   Gastrointestinal:. No masses, surgical incisions, or scars. Musculoskeletal: M/S 5/5 throughout.  Extremities without ischemic changes.  No deformity or atrophy. 1+ BLE edema. Compression stockings in use. Uses a rolling walker Neurologic: Sensation grossly intact in extremities.  Symmetrical.  Speech is fluent. Motor exam as listed above. Psychiatric: Judgment intact, Mood & affect appropriate for pt's clinical situation. Dermatologic: No rashes or ulcers noted.  No cellulitis or open wounds.    Radiology No results found.  Labs Recent Results (from the past 2160 hours)  CBC     Status: Abnormal   Collection Time: 12/19/23  9:10 AM  Result Value Ref Range   WBC 4.8 4.0 - 10.5 K/uL   RBC 3.57 (L) 3.87 - 5.11 MIL/uL   Hemoglobin 10.5 (L) 12.0 - 15.0 g/dL   HCT 16.1 (L) 09.6 - 04.5 %   MCV 89.9 80.0 - 100.0 fL   MCH 29.4 26.0 - 34.0 pg   MCHC 32.7 30.0 - 36.0 g/dL   RDW 40.9 81.1 - 91.4 %   Platelets 193 150 - 400 K/uL   nRBC 0.0 0.0 - 0.2 %    Comment: Performed at [facility], [address] [place], [place] [address]  Comprehensive metabolic panel     Status: Abnormal   Collection Time: 12/19/23  9:10 AM  Result Value Ref Range   Sodium 139 135 - 145 mmol/L   Potassium 3.9 3.5 - 5.1 mmol/L   Chloride 103 98 - 111 mmol/L   CO2 28 22 - 32 mmol/L   Glucose, Bld 105 (H) 70 - 99 mg/dL    Comment: Glucose reference range applies only to samples taken after fasting for at least 8 hours.   BUN 20 6 - 20 mg/dL   Creatinine, Ser 6.21 0.44 - 1.00 mg/dL   Calcium  9.1 8.9 - 10.3 mg/dL   Total Protein 7.2 6.5 - 8.1 g/dL   Albumin 3.6 3.5 - 5.0 g/dL   AST 18 15 - 41 U/L   ALT 19 0 - 44 U/L    Alkaline Phosphatase 65 38 - 126 U/L   Total Bilirubin 0.5 0.0 - 1.2 mg/dL   GFR, Estimated >30 >86 mL/min  Comment: (NOTE) Calculated using the CKD-EPI Creatinine Equation (2021)    Anion gap 8 5 - 15    Comment: Performed at [facility], [address] [place], [place] [address]  APTT     Status: None   Collection Time: 12/19/23  9:10 AM  Result Value Ref Range   aPTT 25 24 - 36 seconds    Comment: Performed at [facility], [address] [place], [place] [address]  Protime-INR     Status: None   Collection Time: 12/19/23  9:10 AM  Result Value Ref Range   Prothrombin Time 13.6 11.4 - 15.2 seconds   INR 1.0 0.8 - 1.2    Comment: (NOTE) INR goal varies based on device and disease states. Performed at [facility], [address] [place], [place] [address]     Assessment/Plan:  Swelling of limb Recommend:  I have had a long discussion with the patient regarding swelling and why it  causes symptoms.  Patient will begin continue graduated compression on a daily basis, and a prescription was given. The patient will  wear the stockings first thing in the morning and removing them in the evening. The patient is instructed specifically not to sleep in the stockings.   In addition, behavioral modification will be initiated.  This will include frequent elevation, use of over the counter pain medications and exercise such as walking.  Consideration for a lymph pump will also be made based upon the effectiveness of conservative therapy.  This would help to improve the edema control and prevent sequela such as ulcers and infections   Patient should undergo duplex ultrasound of the venous system to ensure that DVT or reflux is not present.  The patient will follow-up with me after the ultrasound.   Knee pain, left The patient complains of significant left knee pain and instability with multiple falls.  I would recommend referral to an orthopedic  surgeon for further evaluation of this.  Essential hypertension blood pressure control important in reducing the progression of atherosclerotic disease. On appropriate oral medications.   Morbid obesity (HCC) Weight loss would be of benefit for improving her lower extremity swelling and instability.      Mikki Alexander 03/01/2024, 12:17 PM   This note was created with Dragon medical transcription system.  Any errors from dictation are unintentional.

## 2024-03-03 ENCOUNTER — Other Ambulatory Visit: Payer: Self-pay

## 2024-03-03 ENCOUNTER — Emergency Department
Admission: EM | Admit: 2024-03-03 | Discharge: 2024-03-04 | Disposition: A | Discharge status: Home / Self Care | Attending: Emergency Medicine | Admitting: Emergency Medicine

## 2024-03-03 DIAGNOSIS — M7989 Other specified soft tissue disorders: Secondary | ICD-10-CM | POA: Diagnosis not present

## 2024-03-03 DIAGNOSIS — I10 Essential (primary) hypertension: Secondary | ICD-10-CM | POA: Insufficient documentation

## 2024-03-03 DIAGNOSIS — R600 Localized edema: Secondary | ICD-10-CM | POA: Insufficient documentation

## 2024-03-03 NOTE — ED Triage Notes (Addendum)
 Pt to ED via POV. Pt reports that she went to PCP and doctor said she had fluid in her legs and that she needed fluid pills. Pt has another appt with PCP. Pt is saying that her legs feel tight and that she needs the fluid pills now. Denies CP, SHOB, dizziness, fevers

## 2024-03-03 NOTE — ED Provider Notes (Signed)
 [facility] Provider Note    Event Date/Time   First MD Initiated Contact with Patient 03/03/24 2325     (approximate)   History   Medication Refill   HPI  Lindsay Frye is a 58 y.o. female with history of hypertension who presents with bilateral leg swelling that has been progressing gradually over several weeks.  The patient states that she saw her primary care provider and vascular surgeon.  She has been started on compression stockings but states that the swelling has persisted.  She states that is making it slightly difficult for her to walk.  She denies any shortness of breath or chest pain.  She states that she has been on diuretics previously and feels that she probably needs to be on a diuretic as well.  I reviewed the past medical records.  The patient was seen on 4/29 by Dr. Vonna Guardian from vascular surgery for lower extremity swelling.  Patient was prescribed for graduated compression stockings.  She is scheduled for an outpatient ultrasound.   Physical Exam   Triage Vital Signs: ED Triage Vitals  Encounter Vitals Group     BP 03/03/24 1912 118/67     Systolic BP Percentile --      Diastolic BP Percentile --      Pulse Rate 03/03/24 1912 78     Resp 03/03/24 1912 18     Temp 03/03/24 1912 98.8 F (37.1 C)     Temp Source 03/03/24 1912 Oral     SpO2 03/03/24 1912 96 %     Weight --      Height --      Head Circumference --      Peak Flow --      Pain Score 03/03/24 1906 9     Pain Loc --      Pain Education --      Exclude from Growth Chart --     Most recent vital signs: Vitals:   03/03/24 1912 03/03/24 2325  BP: 118/67   Pulse: 78   Resp: 18   Temp: 98.8 F (37.1 C) 98.7 F (37.1 C)  SpO2: 96%      General: Awake, no distress. CV:  Good peripheral perfusion.  Resp:  Normal effort.  Abd:  No distention.  Other:  1+ bilateral lower extremity edema.  No erythema, induration, or abnormal warmth.   ED Results / Procedures /  Treatments   Labs (all labs ordered are listed, but only abnormal results are displayed) Labs Reviewed - No data to display   EKG    RADIOLOGY    PROCEDURES:  Critical Care performed: No  Procedures   MEDICATIONS ORDERED IN ED: Medications  furosemide  (LASIX ) tablet 40 mg (has no administration in time range)     IMPRESSION / MDM / ASSESSMENT AND PLAN / ED COURSE  I reviewed the triage vital signs and the nursing notes.  57 year old female with PMH as noted above presents with subacute bilateral lower extremity edema.  She was seen by vascular surgery a few days ago and ordered for compression stockings which she has been using, but states that the swelling has persisted.  She feels that she needs to be back on a diuretic which she has used previously.  Differential diagnosis includes, but is not limited to, peripheral edema, venous stasis, less likely CHF.  The patient has no respiratory symptoms or other findings to suggest CHF.  There is no erythema or findings to suggest  cellulitis.  The symptoms are also not consistent with DVT.  She is scheduled for an outpatient ultrasound ordered by vascular surgery.  Patient's presentation is most consistent with acute, uncomplicated illness.  The patient has been here for over 5 hours but did not have any labs drawn or other workup.  I reviewed her past labs; the patient had a chemistry done a few months ago that showed normal renal function.  Given that her symptoms are subacute and she has no respiratory or systemic symptoms, I do not feel that there is a strong indication for lab workup at this time, especially given how long the patient has already waited.  I feel that it would be reasonable to give a limited course of Lasix  for her to take until she follows up with her doctors.  I have prescribed 20 mg daily for 2 weeks.  I advised her that she will need to follow-up with her doctor to have her kidney function rechecked and monitor  her symptoms.  The patient is stable for discharge at this time.  I gave strict return precautions and she expressed understanding.   FINAL CLINICAL IMPRESSION(S) / ED DIAGNOSES   Final diagnoses:  Peripheral edema     Rx / DC Orders   ED Discharge Orders          Ordered    furosemide  (LASIX ) 20 MG tablet  Daily        03/04/24 0023             Note:  This document was prepared using Dragon voice recognition software and may include unintentional dictation errors.    Lind Repine, MD 03/04/24 Merrianne Abbot

## 2024-03-04 MED ORDER — FUROSEMIDE 40 MG PO TABS
40.0000 mg | ORAL_TABLET | Freq: Once | ORAL | Status: AC
  Administered 2024-03-04: 40 mg via ORAL
  Filled 2024-03-04: qty 1

## 2024-03-04 MED ORDER — FUROSEMIDE 20 MG PO TABS: 20.0000 mg | ORAL_TABLET | Freq: Every day | ORAL | 0 refills | Status: AC

## 2024-03-04 NOTE — Discharge Instructions (Signed)
 Take the Lasix  as prescribed.  We have provided a 2-week course.  Follow-up with your primary care provider and your vascular doctor.  Continue to use the compression stockings.  Keep your legs elevated when you are lying down.  Return to the ER for new, worsening, persistent severe leg swelling, shortness of breath, or any other new or worsening symptoms that concern you.

## 2024-04-06 LAB — GLUCOSE, POCT (MANUAL RESULT ENTRY): POC Glucose: 88 mg/dL (ref 70–99)

## 2024-04-06 NOTE — Congregational Nurse Program (Signed)
  Dept: [phone]   Congregational Nurse Program Note  Date of Encounter: 04/06/2024 Client and [facility] requesting BP and glucose check (initial visit). Agrees to visit and co. Re guarding confidentiality. Also requests incontinence supplies (also accesses from [facility] but doesn't last through month). Husband brags that client has been doing well and has recently lost 10 pounds and was baptized last month. Active in church.  Client states she is actively following up with vascular clinic and is followed in [facility] for anxiety. States she has fallen a few times recently. Walks with cane to stabalize. States has recently removed all throw rugs in home. PCP [facility]. Has been recently. States she needs dental care. Referred to [facility]. Co re safety strategies in home, limiting salt and increasing fluid in diet. Reinforced PCP and vascular clinic follow up. Provided one package of incontinence supplies. RTC as needed. Rhermann, RN Past Medical History: No past medical history on file.  Encounter Details:  Community Questionnaire - 04/06/24 1330       Questionnaire   Ask client: Do you give verbal consent for me to treat you today? Yes    Student Assistance N/A    Location Patient Information systems manager, [employer]    Encounter Setting CN site    [employer] Status Unknown    Insurance Medicaid    Insurance/Financial Assistance Referral N/A    Medication N/A    Medical Provider Yes   Stephenie Einstein [facility]   Screening Referrals Made N/A    Medical Referrals Made Dental    Medical Appointment Completed N/A    CNP Interventions Advocate/Support;Educate;Spiritual Care    Screenings CN Performed Blood Pressure;Blood Glucose    ED Visit Averted N/A    Life-Saving Intervention Made N/A

## 2024-04-12 ENCOUNTER — Ambulatory Visit (INDEPENDENT_AMBULATORY_CARE_PROVIDER_SITE_OTHER)

## 2024-04-12 ENCOUNTER — Encounter (INDEPENDENT_AMBULATORY_CARE_PROVIDER_SITE_OTHER): Payer: Self-pay | Admitting: Vascular Surgery

## 2024-04-12 ENCOUNTER — Ambulatory Visit (INDEPENDENT_AMBULATORY_CARE_PROVIDER_SITE_OTHER): Admitting: Vascular Surgery

## 2024-04-12 VITALS — BP 89/60 | HR 67 | Resp 18 | Wt 352.0 lb

## 2024-04-12 DIAGNOSIS — I10 Essential (primary) hypertension: Secondary | ICD-10-CM

## 2024-04-12 DIAGNOSIS — M25562 Pain in left knee: Secondary | ICD-10-CM | POA: Diagnosis not present

## 2024-04-12 DIAGNOSIS — M7989 Other specified soft tissue disorders: Secondary | ICD-10-CM

## 2024-04-12 DIAGNOSIS — G8929 Other chronic pain: Secondary | ICD-10-CM

## 2024-04-12 NOTE — Progress Notes (Signed)
 MRN : [id number]  Lindsay Frye is a 58 y.o. (1965-11-30) female who presents with chief complaint of  Chief Complaint  Patient presents with   Follow-up    Pt conv bil reflux   .  History of Present Illness: Patient returns today in follow up of her leg swelling and numbness.  A few days ago, she developed pretty severe left leg numbness while sitting and it caused a fall.  She has lots of numbness and pins-and-needles.  She has been wearing her compression socks and that has helped her swelling some.  A venous reflux study was performed today showing no evidence of deep venous thrombosis, superficial thrombophlebitis, or significant venous reflux in either lower extremity.  Spotcheck of her arterial flow at the ankle showed normal triphasic waveforms with no evidence of arterial insufficiency.  Current Outpatient Medications  Medication Sig Dispense Refill   furosemide  (LASIX ) 20 MG tablet Take 1 tablet (20 mg total) by mouth daily. 15 tablet 0   hydrochlorothiazide  (HYDRODIURIL ) 25 MG tablet Take 1 tablet (25 mg total) by mouth daily. 60 tablet 0   hydrOXYzine (ATARAX) 25 MG tablet Take 25 mg by mouth at bedtime as needed.     ibuprofen  (ADVIL ) 600 MG tablet Take 1 tablet (600 mg total) by mouth every 6 (six) hours as needed. 30 tablet 0   sertraline (ZOLOFT) 100 MG tablet Take 100 mg by mouth daily.     traMADol  (ULTRAM ) 50 MG tablet Take 50 mg by mouth.     No current facility-administered medications for this visit.    History reviewed. No pertinent past medical history.  Past Surgical History:  Procedure Laterality Date   ABDOMINAL SURGERY     AMPUTATION Right 10/25/2020   Procedure: AMPUTATION DIGIT-Right Middle Finger;  Surgeon: Molli Angelucci, MD;  Location: [facility] ORS;  Service: Orthopedics;  Laterality: Right;   skull plate       Social History   Tobacco Use   Smoking status: Former    Types: Cigarettes   Smokeless tobacco: Never  Vaping Use   Vaping status:  Never Used  Substance Use Topics   Alcohol use: Not Currently   Drug use: No      Family History  Problem Relation Age of Onset   Diabetes Mother    Uterine cancer Mother    Diabetes Sister    Diabetes Sister    Diabetes Brother      No Known Allergies     REVIEW OF SYSTEMS (Negative unless checked)   Constitutional: [] Weight loss  [] Fever  [] Chills Cardiac: [] Chest pain   [] Chest pressure   [] Palpitations   [] Shortness of breath when laying flat   [] Shortness of breath at rest   [x] Shortness of breath with exertion. Vascular:  [] Pain in legs with walking   [] Pain in legs at rest   [] Pain in legs when laying flat   [] Claudication   [] Pain in feet when walking  [] Pain in feet at rest  [] Pain in feet when laying flat   [] History of DVT   [] Phlebitis   [x] Swelling in legs   [] Varicose veins   [] Non-healing ulcers Pulmonary:   [] Uses home oxygen   [] Productive cough   [] Hemoptysis   [] Wheeze  [] COPD   [] Asthma Neurologic:  [] Dizziness  [] Blackouts   [] Seizures   [] History of stroke   [] History of TIA  [] Aphasia   [] Temporary blindness   [] Dysphagia   [] Weakness or numbness in arms   [] Weakness or  numbness in legs Musculoskeletal:  [x] Arthritis   [] Joint swelling   [x] Joint pain   [] Low back pain Hematologic:  [] Easy bruising  [] Easy bleeding   [] Hypercoagulable state   [] Anemic  [] Hepatitis Gastrointestinal:  [] Blood in stool   [] Vomiting blood  [] Gastroesophageal reflux/heartburn   [] Abdominal pain Genitourinary:  [] Chronic kidney disease   [] Difficult urination  [] Frequent urination  [] Burning with urination   [] Hematuria Skin:  [] Rashes   [] Ulcers   [] Wounds Psychological:  [] History of anxiety   []  History of major depression.  Physical Examination  BP (!) 89/60   Pulse 67   Resp 18   Wt (!) 352 lb (159.7 kg)   LMP 05/28/2015   BMI 56.81 kg/m  Gen:  WD/WN, NAD Head: [place]/AT, No temporalis wasting. Ear/Nose/Throat: Hearing grossly intact, nares w/o erythema or  drainage Eyes: Conjunctiva clear. Sclera non-icteric Neck: Supple.  Trachea midline Pulmonary:  Good air movement, no use of accessory muscles.  Cardiac: RRR, no JVD Vascular:  Vessel Right Left  Radial Palpable Palpable                          PT Palpable Palpable  DP Palpable Palpable    Musculoskeletal: M/S 5/5 throughout.  No deformity or atrophy. Trace BLE edema. Neurologic: Sensation grossly intact in extremities.  Symmetrical.  Speech is fluent.  Psychiatric: Judgment intact, Mood & affect appropriate for pt's clinical situation. Dermatologic: No rashes or ulcers noted.  No cellulitis or open wounds.      Labs Recent Results (from the past 2160 hours)  POCT glucose (manual entry)     Status: None   Collection Time: 04/06/24  1:30 PM  Result Value Ref Range   POC Glucose 88 70 - 99 mg/dl    Radiology No results found.  Assessment/Plan  Swelling of limb A venous reflux study was performed today showing no evidence of deep venous thrombosis, superficial thrombophlebitis, or significant venous reflux in either lower extremity.  Spotcheck of her arterial flow at the ankle showed normal triphasic waveforms with no evidence of arterial insufficiency.  Her swelling is better with compression.  Weight loss, compression, and elevation are going to be the most important things to get her swelling under control.  She can follow-up as needed.  Knee pain, left The patient complains of significant left knee pain and instability with multiple falls.  I would recommend referral to an orthopedic surgeon for further evaluation of this. She also is now complaining more of right knee pain and instability and I do believe an orthopedic evaluation is prudent.   Essential hypertension blood pressure control important in reducing the progression of atherosclerotic disease. On appropriate oral medications.     Morbid obesity (HCC) Weight loss would be of benefit for improving her  lower extremity swelling and instability.  Mikki Alexander, MD  04/12/2024 4:41 PM    This note was created with Dragon medical transcription system.  Any errors from dictation are purely unintentional

## 2024-04-12 NOTE — Assessment & Plan Note (Signed)
 A venous reflux study was performed today showing no evidence of deep venous thrombosis, superficial thrombophlebitis, or significant venous reflux in either lower extremity.  Spotcheck of her arterial flow at the ankle showed normal triphasic waveforms with no evidence of arterial insufficiency.  Her swelling is better with compression.  Weight loss, compression, and elevation are going to be the most important things to get her swelling under control.  She can follow-up as needed.

## 2024-06-15 ENCOUNTER — Other Ambulatory Visit: Payer: Self-pay

## 2024-06-15 DIAGNOSIS — R1900 Intra-abdominal and pelvic swelling, mass and lump, unspecified site: Secondary | ICD-10-CM | POA: Insufficient documentation

## 2024-06-15 DIAGNOSIS — T8131XA Disruption of external operation (surgical) wound, not elsewhere classified, initial encounter: Secondary | ICD-10-CM | POA: Diagnosis present

## 2024-06-15 DIAGNOSIS — Z4800 Encounter for change or removal of nonsurgical wound dressing: Secondary | ICD-10-CM | POA: Diagnosis not present

## 2024-06-15 LAB — CBC WITH DIFFERENTIAL/PLATELET
Abs Immature Granulocytes: 0.04 K/uL (ref 0.00–0.07)
Basophils Absolute: 0 K/uL (ref 0.0–0.1)
Basophils Relative: 0 %
Eosinophils Absolute: 0.2 K/uL (ref 0.0–0.5)
Eosinophils Relative: 2 %
HCT: 40.3 % (ref 36.0–46.0)
Hemoglobin: 13.4 g/dL (ref 12.0–15.0)
Immature Granulocytes: 1 %
Lymphocytes Relative: 40 %
Lymphs Abs: 3.5 K/uL (ref 0.7–4.0)
MCH: 29.6 pg (ref 26.0–34.0)
MCHC: 33.3 g/dL (ref 30.0–36.0)
MCV: 89.2 fL (ref 80.0–100.0)
Monocytes Absolute: 0.7 K/uL (ref 0.1–1.0)
Monocytes Relative: 8 %
Neutro Abs: 4.2 K/uL (ref 1.7–7.7)
Neutrophils Relative %: 49 %
Platelets: 251 K/uL (ref 150–400)
RBC: 4.52 MIL/uL (ref 3.87–5.11)
RDW: 14.3 % (ref 11.5–15.5)
WBC: 8.6 K/uL (ref 4.0–10.5)
nRBC: 0 % (ref 0.0–0.2)

## 2024-06-15 NOTE — ED Triage Notes (Addendum)
 Pt reports she just noticed wound to her stomach in between her folds. Pt has open area with no drainage noted at this time. Area red and open.

## 2024-06-16 ENCOUNTER — Emergency Department
Admission: EM | Admit: 2024-06-16 | Discharge: 2024-06-16 | Disposition: A | Discharge status: Home / Self Care | Attending: Emergency Medicine | Admitting: Emergency Medicine

## 2024-06-16 DIAGNOSIS — Z5189 Encounter for other specified aftercare: Secondary | ICD-10-CM

## 2024-06-16 DIAGNOSIS — T8130XA Disruption of wound, unspecified, initial encounter: Secondary | ICD-10-CM

## 2024-06-16 LAB — BASIC METABOLIC PANEL WITH GFR
Anion gap: 11 (ref 5–15)
BUN: 28 mg/dL — ABNORMAL HIGH (ref 6–20)
CO2: 27 mmol/L (ref 22–32)
Calcium: 8.9 mg/dL (ref 8.9–10.3)
Chloride: 100 mmol/L (ref 98–111)
Creatinine, Ser: 0.8 mg/dL (ref 0.44–1.00)
GFR, Estimated: >60 mL/min (ref >60)
Glucose, Bld: 115 mg/dL — ABNORMAL HIGH (ref 70–99)
Potassium: 3.6 mmol/L (ref 3.5–5.1)
Sodium: 138 mmol/L (ref 135–145)

## 2024-06-16 NOTE — Discharge Instructions (Signed)
 Continue the packing and dressing that we showed you  Reach out to the wound care clinic  Return to the ED with any worsening symptoms

## 2024-06-16 NOTE — ED Notes (Signed)
 Claudene MD at bedside ?

## 2024-06-16 NOTE — ED Notes (Signed)
 Wet to dry dressing placed on wound. Pt tolerated well. NADN. Pt educated on wet to dry dressing and verbalized understanding.

## 2024-06-16 NOTE — ED Provider Notes (Signed)
 [facility] Provider Note    Event Date/Time   First MD Initiated Contact with Patient 06/16/24 0145     (approximate)   History   Wound Check   HPI  Lindsay Frye is a 58 y.o. female who presents to the ED for evaluation of Wound Check   Morbidly obese patient presents with concerns for a wound to her abdominal pannus.  Reports a remote classical C-section and reports getting out of shower today, she turned and reports she opened up a wound to the old surgical scar.  Reports that just happened today.  No fevers or discharge   Physical Exam   Triage Vital Signs: ED Triage Vitals  Encounter Vitals Group     BP 06/15/24 2321 119/86     Girls Systolic BP Percentile --      Girls Diastolic BP Percentile --      Boys Systolic BP Percentile --      Boys Diastolic BP Percentile --      Pulse Rate 06/15/24 2321 67     Resp 06/15/24 2321 19     Temp 06/15/24 2321 98.7 F (37.1 C)     Temp Source 06/15/24 2321 Oral     SpO2 06/15/24 2321 98 %     Weight 06/15/24 2322 (!) 350 lb (158.8 kg)     Height 06/15/24 2322 5' 6 (1.676 m)     Head Circumference --      Peak Flow --      Pain Score 06/15/24 2320 0     Pain Loc --      Pain Education --      Exclude from Growth Chart --     Most recent vital signs: Vitals:   06/15/24 2321 06/16/24 0247  BP: 119/86 112/65  Pulse: 67 61  Resp: 19 18  Temp: 98.7 F (37.1 C) 98.3 F (36.8 C)  SpO2: 98% 99%    General: Awake, no distress.  CV:  Good peripheral perfusion.  Resp:  Normal effort.  Abd:  No distention.  MSK:  No deformity noted.  Neuro:  No focal deficits appreciated. Other:  As pictured below, a approximately 3 cm vertically oriented wound to her previous midline infraumbilical surgical scar.  No signs of superimposed infection   ED Results / Procedures / Treatments   Labs (all labs ordered are listed, but only abnormal results are displayed) Labs Reviewed  BASIC METABOLIC PANEL  WITH GFR - Abnormal; Notable for the following components:      Result Value   Glucose, Bld 115 (*)    BUN 28 (*)    All other components within normal limits  CBC WITH DIFFERENTIAL/PLATELET    EKG   RADIOLOGY   Official radiology report(s): No results found.  PROCEDURES and INTERVENTIONS:  Procedures  Medications - No data to display   IMPRESSION / MDM / ASSESSMENT AND PLAN / ED COURSE  I reviewed the triage vital signs and the nursing notes.  Differential diagnosis includes, but is not limited to, cellulitis, abscess, wound dehiscence,  {Patient presents with symptoms of an acute illness or injury that is potentially life-threatening.  Patient presents with an abdominal wound suitable for wet-to-dry dressings, local wound care and wound care clinic follow-up.  No signs of sepsis or systemic illness.  Normal CBC and metabolic panel.  No indications for antibiotics at this point      FINAL CLINICAL IMPRESSION(S) / ED DIAGNOSES   Final diagnoses:  Visit for wound check  Wound dehiscence     Rx / DC Orders   ED Discharge Orders     None        Note:  This document was prepared using Dragon voice recognition software and may include unintentional dictation errors.   Claudene Rover, MD 06/16/24 [id number]

## 2024-07-06 ENCOUNTER — Other Ambulatory Visit: Payer: Self-pay | Admitting: Family Medicine

## 2024-07-06 DIAGNOSIS — N939 Abnormal uterine and vaginal bleeding, unspecified: Secondary | ICD-10-CM

## 2024-07-12 ENCOUNTER — Ambulatory Visit
Admission: RE | Admit: 2024-07-12 | Discharge: 2024-07-12 | Disposition: A | Discharge status: Home / Self Care | Source: Ambulatory Visit | Attending: Family Medicine | Admitting: Family Medicine

## 2024-07-12 DIAGNOSIS — N939 Abnormal uterine and vaginal bleeding, unspecified: Secondary | ICD-10-CM | POA: Diagnosis present

## 2024-07-14 ENCOUNTER — Encounter: Attending: Physician Assistant | Admitting: Physician Assistant

## 2024-07-14 DIAGNOSIS — I10 Essential (primary) hypertension: Secondary | ICD-10-CM | POA: Diagnosis not present

## 2024-07-14 DIAGNOSIS — L988 Other specified disorders of the skin and subcutaneous tissue: Secondary | ICD-10-CM | POA: Insufficient documentation

## 2024-10-26 ENCOUNTER — Encounter: Payer: Self-pay | Admitting: Emergency Medicine

## 2024-10-26 ENCOUNTER — Ambulatory Visit
Admission: EM | Admit: 2024-10-26 | Discharge: 2024-10-26 | Disposition: A | Discharge status: Home / Self Care | Attending: Emergency Medicine | Admitting: Emergency Medicine

## 2024-10-26 DIAGNOSIS — B349 Viral infection, unspecified: Secondary | ICD-10-CM | POA: Diagnosis not present

## 2024-10-26 HISTORY — DX: Essential (primary) hypertension: I10

## 2024-10-26 HISTORY — DX: Anxiety disorder, unspecified: F41.9

## 2024-10-26 HISTORY — DX: Localized edema: R60.0

## 2024-10-26 HISTORY — DX: Depression, unspecified: F32.A

## 2024-10-26 LAB — POC SOFIA SARS ANTIGEN FIA: SARS Coronavirus 2 Ag: NEGATIVE

## 2024-10-26 MED ORDER — BENZONATATE 100 MG PO CAPS: 100.0000 mg | ORAL_CAPSULE | Freq: Three times a day (TID) | ORAL | 0 refills | Status: AC

## 2024-10-26 MED ORDER — PROMETHAZINE-DM 6.25-15 MG/5ML PO SYRP: 5.0000 mL | ORAL_SOLUTION | Freq: Every evening | ORAL | 0 refills | Status: AC | PRN

## 2024-10-26 MED ORDER — PAXLOVID (300/100) 20 X 150 MG & 10 X 100MG PO TBPK: 3.0000 | ORAL_TABLET | Freq: Two times a day (BID) | ORAL | 0 refills | Status: AC

## 2024-10-26 NOTE — Discharge Instructions (Addendum)
 Your symptoms today are most likely being caused by a virus and should steadily improve in time it can take up to 7 to 10 days before you truly start to see a turnaround however things will get better  Take Paxlovid  twice a day for 5 days, helps to reduce the amount if germ if Covid is present  Covid testing negative   May use Tessalon  pill every 8 hours as needed for cough May use cough syrup at bedtime to allow for rest    You can take Tylenol  and/or Ibuprofen  as needed for fever reduction and pain relief.   For cough: honey 1/2 to 1 teaspoon (you can dilute the honey in water or another fluid).  You can also use guaifenesin and dextromethorphan for cough. You can use a humidifier for chest congestion and cough.  If you don't have a humidifier, you can sit in the bathroom with the hot shower running.      For sore throat: try warm salt water gargles, cepacol lozenges, throat spray, warm tea or water with lemon/honey, popsicles or ice, or OTC cold relief medicine for throat discomfort.   For congestion: take a daily anti-histamine like Zyrtec, Claritin, and a oral decongestant, such as pseudoephedrine.  You can also use Flonase 1-2 sprays in each nostril daily.   It is important to stay hydrated: drink plenty of fluids (water, gatorade/powerade/pedialyte, juices, or teas) to keep your throat moisturized and help further relieve irritation/discomfort.

## 2024-10-26 NOTE — ED Provider Notes (Signed)
 " [facility]    CSN: [id number] Arrival date & time: 10/26/24  1423      History   Chief Complaint No chief complaint on file.   HPI BIBI ECONOMOS is a 58 y.o. female.   Patient presents for evaluation of nasal congestion, rhinorrhea and a nonproductive cough present for 5 days.  No known sick contacts prior, has been has similar symptoms.  Tolerable to food and liquids.  Denies fever, shortness of breath or wheezing.  Denies respiratory history, non-smoker.  Has attempted use of over-the-counter cold and flu medicine, Coricidin and Mucinex.   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      Home Medications    Prior to Admission medications  Medication Sig Start Date End Date Taking? Authorizing Provider  furosemide  (LASIX ) 20 MG tablet Take 1 tablet (20 mg total) by mouth daily. 03/04/24 03/04/25  Jacolyn Pae, MD  hydrochlorothiazide  (HYDRODIURIL ) 25 MG tablet Take 1 tablet (25 mg total) by mouth daily. 11/16/23     hydrOXYzine (ATARAX) 25 MG tablet Take 25 mg by mouth at bedtime as needed. 02/23/24   [provider]  ibuprofen  (ADVIL ) 600 MG tablet Take 1 tablet (600 mg total) by mouth every 6 (six) hours as needed. 04/10/23   Saunders Shona CROME, PA-C  sertraline (ZOLOFT) 100 MG tablet Take 100 mg by mouth daily. 02/08/24    [provider]  traMADol  (ULTRAM ) 50 MG tablet Take 50 mg by mouth. 01/30/21   [provider]    Family History Family History  Problem Relation Age of Onset   Diabetes Mother    Uterine cancer Mother    Diabetes Sister    Diabetes Sister    Diabetes Brother     Social History Social History[1]   Allergies   Patient has no known allergies.   Review of Systems Review of Systems   Physical Exam Triage Vital Signs ED Triage Vitals  Encounter Vitals Group     BP      Girls Systolic BP Percentile      Girls Diastolic BP Percentile      Boys Systolic BP Percentile      Boys Diastolic BP Percentile      Pulse      Resp      Temp      Temp src      SpO2      Weight      Height      Head Circumference      Peak Flow      Pain Score      Pain Loc      Pain Education      Exclude from Growth Chart    No data found.  Updated Vital Signs  LMP 05/28/2015   Visual Acuity Right Eye Distance:   Left Eye Distance:   Bilateral Distance:    Right Eye Near:   Left Eye Near:    Bilateral Near:     Physical Exam Constitutional:      Appearance: Normal appearance.  HENT:     Right Ear: Tympanic membrane, ear canal and external ear normal.     Left Ear: Tympanic membrane, ear canal and external ear normal.     Nose: Congestion present.     Mouth/Throat:     Pharynx: Posterior oropharyngeal erythema present. No oropharyngeal exudate.  Eyes:     Extraocular Movements: Extraocular movements intact.  Cardiovascular:     Rate and Rhythm: Normal rate and regular rhythm.     Pulses: Normal pulses.     Heart sounds: Normal heart sounds.  Pulmonary:     Effort: Pulmonary effort is normal.     Breath sounds: Normal breath sounds.  Neurological:     Mental Status: She is alert and oriented to person, place, and time. Mental status is at baseline.      UC Treatments / Results  Labs (all labs ordered are listed, but only abnormal results are  displayed) Labs Reviewed  POC SOFIA SARS ANTIGEN FIA    EKG   Radiology No results found.  Procedures Procedures (including critical care time)  Medications Ordered in UC Medications - No data to display  Initial Impression / Assessment and Plan / UC Course  I have reviewed the triage vital signs and the nursing notes.  Pertinent labs & imaging results that were available during my care of the patient were reviewed by me and considered in my medical decision making (see chart for details).  Viral illness  Patient is in no signs of distress nor toxic appearing.  Vital signs are stable.  Low suspicion for pneumonia, pneumothorax or bronchitis and therefore will defer imaging.  COVID testing negative.  Accompanied by husband who tested positive empirically placed on Paxlovid  as they have the same symptoms, additionally prescribed Tessalon  and Promethazine  DM. May use additional over-the-counter medications as needed for supportive care.  May follow-up with urgent care as needed if symptoms persist or worsen.   Final Clinical Impressions(s) / UC Diagnoses   Final diagnoses:  Viral illness   Discharge Instructions   None    ED Prescriptions   None    PDMP not reviewed this encounter.     [1]  Social History Tobacco Use   Smoking status: Former    Types: Cigarettes   Smokeless tobacco: Never  Vaping Use   Vaping status: Never Used  Substance Use Topics   Alcohol use: Not Currently   Drug use: No     Teresa Shelba SAUNDERS, NP 10/26/24 1608  "

## 2024-10-26 NOTE — ED Notes (Signed)
 Patient triage by Provider Teresa Shelba SAUNDERS, NP .

## 2024-11-26 ENCOUNTER — Other Ambulatory Visit: Payer: Self-pay

## 2024-11-26 ENCOUNTER — Emergency Department
Admission: EM | Admit: 2024-11-26 | Discharge: 2024-11-26 | Disposition: A | Discharge status: Home / Self Care | Attending: Emergency Medicine | Admitting: Emergency Medicine

## 2024-11-26 DIAGNOSIS — I10 Essential (primary) hypertension: Secondary | ICD-10-CM | POA: Insufficient documentation

## 2024-11-26 DIAGNOSIS — M273 Alveolitis of jaws: Secondary | ICD-10-CM | POA: Insufficient documentation

## 2024-11-26 DIAGNOSIS — K0889 Other specified disorders of teeth and supporting structures: Secondary | ICD-10-CM | POA: Diagnosis present

## 2024-11-26 HISTORY — DX: Unspecified osteoarthritis, unspecified site: M19.90

## 2024-11-26 MED ORDER — LIDOCAINE VISCOUS HCL 2 % MT SOLN
15.0000 mL | Freq: Once | OROMUCOSAL | Status: AC
  Administered 2024-11-26: 15 mL via OROMUCOSAL
  Filled 2024-11-26: qty 15

## 2024-11-26 MED ORDER — NAPROXEN 500 MG PO TABS
500.0000 mg | ORAL_TABLET | Freq: Once | ORAL | Status: AC
  Administered 2024-11-26: 500 mg via ORAL
  Filled 2024-11-26: qty 1

## 2024-11-26 NOTE — ED Triage Notes (Signed)
 Pt to ED with husband for dental pain after having several teeth pulled 2-3 days ago. States fragments of bone are still pulling out.

## 2024-11-26 NOTE — ED Provider Notes (Signed)
 "  [facility] Provider Note    Event Date/Time   First MD Initiated Contact with Patient 11/26/24 1815     (approximate)   History   Dental Pain   HPI  Lindsay Frye is a 59 y.o. female with a history of hypertension and obesity who comes ED complaining of dental pain after tooth extraction 2 or 3 days ago.  Has 1 upper tooth and 2 lower teeth extracted.  Complains of upper jaw pain.  No fever.  No difficulty swallowing or breathing.      Physical Exam   Triage Vital Signs: ED Triage Vitals  Encounter Vitals Group     BP 11/26/24 1754 107/89     Girls Systolic BP Percentile --      Girls Diastolic BP Percentile --      Boys Systolic BP Percentile --      Boys Diastolic BP Percentile --      Pulse Rate 11/26/24 1754 72     Resp 11/26/24 1754 20     Temp 11/26/24 1754 98.3 F (36.8 C)     Temp Source 11/26/24 1754 Oral     SpO2 11/26/24 1754 96 %     Weight 11/26/24 1753 (!) 350 lb (158.8 kg)     Height 11/26/24 1753 5' 3 (1.6 m)     Head Circumference --      Peak Flow --      Pain Score 11/26/24 1833 7     Pain Loc --      Pain Education --      Exclude from Growth Chart --     Most recent vital signs: Vitals:   11/26/24 1900 11/26/24 1915  BP: 136/85 (!) 137/118  Pulse: 63 70  Resp:  18  Temp:  98.2 F (36.8 C)  SpO2: 100% 100%    General: Awake, no distress.  CV:  Good peripheral perfusion.  Resp:  Normal effort.  Abd:  No distention.  Other:  Left upper jaw has an area of recent extraction with dry socket.  Left lower jaw with tenderness area where to adjacent teeth were extracted, appears to be healing appropriately.  No swelling or fluctuance or purulent drainage from either wound.   ED Results / Procedures / Treatments   Labs (all labs ordered are listed, but only abnormal results are displayed) Labs Reviewed - No data to display   RADIOLOGY    PROCEDURES:  Procedures   MEDICATIONS ORDERED IN  ED: Medications  naproxen  (NAPROSYN ) tablet 500 mg (500 mg Oral Given 11/26/24 1833)  lidocaine  (XYLOCAINE ) 2 % viscous mouth solution 15 mL (15 mLs Mouth/Throat Given 11/26/24 1833)     IMPRESSION / MDM / ASSESSMENT AND PLAN / ED COURSE  I reviewed the triage vital signs and the nursing notes.                             Presents with dental pain after extraction, exam reveals dry socket on the mandible.  Patient given oral lidocaine  solution, and then dental cement was mixed and applied to the dry socket without complication.  ----------------------------------------- 7:07 PM on 11/26/2024 ----------------------------------------- Pain resolved after dental cement applied.     FINAL CLINICAL IMPRESSION(S) / ED DIAGNOSES   Final diagnoses:  Dry socket     Rx / DC Orders   ED Discharge Orders     None  Note:  This document was prepared using Dragon voice recognition software and may include unintentional dictation errors.   Viviann Pastor, MD 11/26/24 2055  "

## 2024-11-26 NOTE — ED Notes (Signed)
 Reports she had 3 teeth pulled and pus it leaking in her mouth when she bites down. Small amount of purulent drainage noted at this time. Edema and redness noted to Lower gum tissue.
